# Patient Record
Sex: Male | Born: 1960 | Race: White | Hispanic: No | Marital: Married | Smoking: Former smoker
Health system: Southern US, Community
[De-identification: ages and names within clinical notes are randomized; demographics above are authoritative.]

## PROBLEM LIST (undated history)

## (undated) DIAGNOSIS — E785 Hyperlipidemia, unspecified: Secondary | ICD-10-CM

## (undated) DIAGNOSIS — K219 Gastro-esophageal reflux disease without esophagitis: Secondary | ICD-10-CM

## (undated) HISTORY — DX: Hyperlipidemia, unspecified: E78.5

## (undated) HISTORY — DX: Gastro-esophageal reflux disease without esophagitis: K21.9

---
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
## 2000-06-21 ENCOUNTER — Encounter: Admission: RE | Admit: 2000-06-21 | Discharge: 2000-06-21 | Payer: Self-pay | Admitting: Urology

## 2000-06-21 ENCOUNTER — Encounter: Payer: Self-pay | Admitting: Urology

## 2003-06-15 HISTORY — PX: VASECTOMY: SHX75

## 2004-01-15 ENCOUNTER — Ambulatory Visit (HOSPITAL_COMMUNITY): Admission: RE | Admit: 2004-01-15 | Discharge: 2004-01-15 | Payer: Self-pay | Admitting: Family Medicine

## 2004-09-11 ENCOUNTER — Encounter: Admission: RE | Admit: 2004-09-11 | Discharge: 2004-09-11 | Payer: Self-pay | Admitting: Family Medicine

## 2011-10-19 ENCOUNTER — Telehealth (INDEPENDENT_AMBULATORY_CARE_PROVIDER_SITE_OTHER): Payer: Self-pay | Admitting: Surgery

## 2011-10-25 ENCOUNTER — Encounter (INDEPENDENT_AMBULATORY_CARE_PROVIDER_SITE_OTHER): Payer: Self-pay | Admitting: Surgery

## 2011-10-25 ENCOUNTER — Ambulatory Visit (INDEPENDENT_AMBULATORY_CARE_PROVIDER_SITE_OTHER): Payer: BC Managed Care – PPO | Admitting: Surgery

## 2011-10-25 ENCOUNTER — Other Ambulatory Visit (INDEPENDENT_AMBULATORY_CARE_PROVIDER_SITE_OTHER): Payer: Self-pay | Admitting: Surgery

## 2011-10-25 VITALS — BP 120/78 | HR 59 | Temp 98.7°F | Ht 68.25 in | Wt 151.4 lb

## 2011-10-25 DIAGNOSIS — K4090 Unilateral inguinal hernia, without obstruction or gangrene, not specified as recurrent: Secondary | ICD-10-CM

## 2011-10-25 NOTE — Patient Instructions (Signed)
Hernia  A hernia occurs when an internal organ pushes out through a weak spot in the abdominal wall. Hernias most commonly occur in the groin and around the navel. Hernias often can be pushed back into place (reduced). Most hernias tend to get worse over time. Some abdominal hernias can get stuck in the opening (irreducible or incarcerated hernia) and cannot be reduced. An irreducible abdominal hernia which is tightly squeezed into the opening is at risk for impaired blood supply (strangulated hernia). A strangulated hernia is a medical emergency. Because of the risk for an irreducible or strangulated hernia, surgery may be recommended to repair a hernia.  CAUSES    Heavy lifting.   Prolonged coughing.   Straining to have a bowel movement.   A cut (incision) made during an abdominal surgery.  HOME CARE INSTRUCTIONS    Bed rest is not required. You may continue your normal activities.   Avoid lifting more than 10 pounds (4.5 kg) or straining.   Cough gently. If you are a smoker it is best to stop. Even the best hernia repair can break down with the continual strain of coughing. Even if you do not have your hernia repaired, a cough will continue to aggravate the problem.   Do not wear anything tight over your hernia. Do not try to keep it in with an outside bandage or truss. These can damage abdominal contents if they are trapped within the hernia sac.   Eat a normal diet.   Avoid constipation. Straining over long periods of time will increase hernia size and encourage breakdown of repairs. If you cannot do this with diet alone, stool softeners may be used.  SEEK IMMEDIATE MEDICAL CARE IF:    You have a fever.   You develop increasing abdominal pain.   You feel nauseous or vomit.   Your hernia is stuck outside the abdomen, looks discolored, feels hard, or is tender.   You have any changes in your bowel habits or in the hernia that are unusual for you.   You have increased pain or swelling around the  hernia.   You cannot push the hernia back in place by applying gentle pressure while lying down.  MAKE SURE YOU:    Understand these instructions.   Will watch your condition.   Will get help right away if you are not doing well or get worse.  Document Released: 05/31/2005 Document Revised: 05/20/2011 Document Reviewed: 01/18/2008  ExitCare Patient Information 2012 [employer].

## 2011-10-25 NOTE — Progress Notes (Signed)
Subjective:     Patient ID: Jonathon Lopez, male   DOB: [birth year], 51 y.o.   MRN: [id number]  HPI  Jonathon Lopez  [birth year] [id number]  Patient Care Team: Catha Gosselin, MD as PCP - General (Family Medicine)  This patient is a 51 y.o.male who presents today for surgical evaluation at the request of Dr. Clarene Duke.   Reason for visit: Right groin swelling. Probable hernia.  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 Noticed a lump & right groin pain after a work out. Was a ~2 months ago. Lump is now palpable & noticeable in the mirror. Some discomfort with it. No problems on the left side.  Quite active. Daily problems. No history of infection. Based on concerns, he when saw his primary care physician. Dr. Clarene Duke sent the patient to me for consideration of hernia repair.  Patient Active Problem List  Diagnoses  . Right inguinal hernia    Past Medical History  Diagnosis Date  . GERD (gastroesophageal reflux disease)   . Hyperlipidemia     Past Surgical History  Procedure Date  . Shoulder surgery 1978  . Vasectomy 2005    History   Social History  . Marital Status: Married    Spouse Name: N/A    Number of Children: N/A  . Years of Education: N/A   Occupational History  . Not on file.   Social History Main Topics  . Smoking status: Former Games developer  . Smokeless tobacco: Former Neurosurgeon    Quit date: 10/25/1995  . Alcohol Use: No  . Drug Use: No  . Sexually Active:    Other Topics Concern  . Not on file   Social History Narrative  . No narrative on file    Family History  Problem Relation Age of Onset  . Cancer Mother     liver    Current Outpatient Prescriptions  Medication Sig Dispense Refill  . lovastatin (MEVACOR) 40 MG tablet          No Known Allergies  BP 120/78  Pulse 59  Temp(Src) 98.7 F (37.1 C) (Temporal)  Ht 5' 8.25" (1.734 m)  Wt 151 lb 6.4 oz (68.675 kg)  BMI 22.85 kg/m2  SpO2 98%  No results found.   Review of Systems  Constitutional:  Negative for fever, chills and diaphoresis.  HENT: Negative for nosebleeds, sore throat, facial swelling, mouth sores, trouble swallowing and ear discharge.   Eyes: Negative for photophobia, discharge and visual disturbance.  Respiratory: Negative for choking, chest tightness, shortness of breath and stridor.   Cardiovascular: Negative for chest pain and palpitations.       Patient walks 3 hours for about 10 miles without difficulty.  No exertional chest/neck/shoulder/arm pain.   Gastrointestinal: Negative for nausea, vomiting, abdominal pain, diarrhea, constipation, blood in stool, abdominal distention, anal bleeding and rectal pain.  Genitourinary: Negative for dysuria, urgency, difficulty urinating and testicular pain.  Musculoskeletal: Negative for myalgias, back pain, arthralgias and gait problem.  Skin: Negative for color change, pallor, rash and wound.  Neurological: Negative for dizziness, speech difficulty, weakness, numbness and headaches.  Hematological: Negative for adenopathy. Does not bruise/bleed easily.  Psychiatric/Behavioral: Negative for hallucinations, confusion and agitation.       Objective:   Physical Exam  Constitutional: He is oriented to person, place, and time. He appears well-developed and well-nourished. No distress.  HENT:  Head: Normocephalic.  Mouth/Throat: Oropharynx is clear and moist. No oropharyngeal exudate.  Eyes: Conjunctivae and EOM are normal. Pupils are equal, round, and  reactive to light. No scleral icterus.  Neck: Normal range of motion. Neck supple. No tracheal deviation present.  Cardiovascular: Normal rate, regular rhythm and intact distal pulses.   Pulmonary/Chest: Effort normal and breath sounds normal. No respiratory distress.  Abdominal: Soft. He exhibits no distension. There is no tenderness. A hernia is present. Hernia confirmed positive in the right inguinal area. Hernia confirmed negative in the ventral area and confirmed negative in  the left inguinal area.    Musculoskeletal: Normal range of motion. He exhibits no tenderness.  Lymphadenopathy:    He has no cervical adenopathy.       Right: No inguinal adenopathy present.       Left: No inguinal adenopathy present.  Neurological: He is alert and oriented to person, place, and time. No cranial nerve deficit. He exhibits normal muscle tone. Coordination normal.  Skin: Skin is warm and dry. No rash noted. He is not diaphoretic. No erythema. No pallor.  Psychiatric: He has a normal mood and affect. His behavior is normal. Judgment and thought content normal.       Assessment:     RIH      Plan:     Lap RIH repair:  The anatomy & physiology of the abdominal wall and pelvic floor was discussed.  The pathophysiology of hernias in the inguinal and pelvic region was discussed.  Natural history risks such as progressive enlargement, pain, incarceration & strangulation was discussed.   Contributors to complications such as smoking, obesity, diabetes, prior surgery, etc were discussed.    I feel the risks of no intervention will lead to serious problems that outweigh the operative risks; therefore, I recommended surgery to reduce and repair the hernia.  I explained laparoscopic techniques with possible need for an open approach.  I noted usual use of mesh to patch and/or buttress hernia repair  Risks such as bleeding, infection, abscess, need for further treatment, heart attack, death, and other risks were discussed.  I noted a good likelihood this will help address the problem.   Goals of post-operative recovery were discussed as well.  Possibility that this will not correct all symptoms was explained.  I stressed the importance of low-impact activity, aggressive pain control, avoiding constipation, & not pushing through pain to minimize risk of post-operative chronic pain or injury. Possibility of reherniation was discussed.  We will work to minimize complications.     An  educational handout further explaining the pathology & treatment options was given as well.  Questions were answered.  The patient expresses understanding & wishes to proceed with surgery.

## 2011-10-26 ENCOUNTER — Telehealth (INDEPENDENT_AMBULATORY_CARE_PROVIDER_SITE_OTHER): Payer: Self-pay | Admitting: Surgery

## 2011-10-26 ENCOUNTER — Other Ambulatory Visit (INDEPENDENT_AMBULATORY_CARE_PROVIDER_SITE_OTHER): Payer: Self-pay | Admitting: Surgery

## 2011-10-27 ENCOUNTER — Ambulatory Visit (INDEPENDENT_AMBULATORY_CARE_PROVIDER_SITE_OTHER): Payer: Self-pay | Admitting: General Surgery

## 2011-10-28 DIAGNOSIS — K4090 Unilateral inguinal hernia, without obstruction or gangrene, not specified as recurrent: Secondary | ICD-10-CM

## 2011-11-10 ENCOUNTER — Ambulatory Visit (INDEPENDENT_AMBULATORY_CARE_PROVIDER_SITE_OTHER): Payer: Self-pay | Admitting: Surgery

## 2011-11-15 ENCOUNTER — Ambulatory Visit (INDEPENDENT_AMBULATORY_CARE_PROVIDER_SITE_OTHER): Payer: Self-pay | Admitting: General Surgery

## 2011-11-18 ENCOUNTER — Ambulatory Visit (INDEPENDENT_AMBULATORY_CARE_PROVIDER_SITE_OTHER): Payer: BC Managed Care – PPO | Admitting: Surgery

## 2011-11-18 ENCOUNTER — Encounter (INDEPENDENT_AMBULATORY_CARE_PROVIDER_SITE_OTHER): Payer: Self-pay | Admitting: Surgery

## 2011-11-18 VITALS — BP 120/68 | HR 72 | Temp 97.3°F | Resp 18 | Ht 68.0 in | Wt 152.2 lb

## 2011-11-18 DIAGNOSIS — K4090 Unilateral inguinal hernia, without obstruction or gangrene, not specified as recurrent: Secondary | ICD-10-CM

## 2011-11-18 NOTE — Progress Notes (Signed)
Subjective:     Patient ID: Jonathon Lopez, male   DOB: 1960-07-06, 51 y.o.   MRN: [id number]  HPI  Jonathon Lopez  1961/01/23 [id number]  Patient Care Team: Catha Gosselin, MD as PCP - General (Family Medicine)  This patient is a 51 y.o.male who presents today for surgical evaluation.   Procedure: Laparoscopic right inguinal hernia repair 10/28/2011  The patient comes in today feeling well.  Jogging some. Had some soreness with squat psoas holding off on the more intense weightlifting/Jim work. Had some burning with urination the first day but got better. Had mild constipation but is better. Feeling well. No fevers chills or sweats  Patient Active Problem List  Diagnoses  (none) - all problems resolved or deleted    Past Medical History  Diagnosis Date  . GERD (gastroesophageal reflux disease)   . Hyperlipidemia     Past Surgical History  Procedure Date  . Shoulder surgery 1978  . Vasectomy 2005    History   Social History  . Marital Status: Married    Spouse Name: N/A    Number of Children: N/A  . Years of Education: N/A   Occupational History  . Not on file.   Social History Main Topics  . Smoking status: Former Games developer  . Smokeless tobacco: Former Neurosurgeon    Quit date: 10/25/1995  . Alcohol Use: No  . Drug Use: No  . Sexually Active:    Other Topics Concern  . Not on file   Social History Narrative  . No narrative on file    Family History  Problem Relation Age of Onset  . Cancer Mother     liver    Current Outpatient Prescriptions  Medication Sig Dispense Refill  . lovastatin (MEVACOR) 40 MG tablet          No Known Allergies  BP 120/68  Pulse 72  Temp(Src) 97.3 F (36.3 C) (Temporal)  Resp 18  Ht 5\' 8"  (1.727 m)  Wt 152 lb 3.2 oz (69.037 kg)  BMI 23.14 kg/m2  No results found.   Review of Systems  Constitutional: Negative for fever, chills and diaphoresis.  HENT: Negative for sore throat, trouble swallowing and neck pain.   Eyes:  Negative for photophobia and visual disturbance.  Respiratory: Negative for choking and shortness of breath.   Cardiovascular: Negative for chest pain and palpitations.  Gastrointestinal: Negative for nausea, vomiting, abdominal distention, anal bleeding and rectal pain.  Genitourinary: Negative for dysuria, urgency, difficulty urinating and testicular pain.  Musculoskeletal: Negative for myalgias, arthralgias and gait problem.  Skin: Negative for color change and rash.  Neurological: Negative for dizziness, speech difficulty, weakness and numbness.  Hematological: Negative for adenopathy.  Psychiatric/Behavioral: Negative for hallucinations, confusion and agitation.       Objective:   Physical Exam  Constitutional: He is oriented to person, place, and time. He appears well-developed and well-nourished. No distress.  HENT:  Head: Normocephalic.  Mouth/Throat: Oropharynx is clear and moist. No oropharyngeal exudate.  Eyes: Conjunctivae and EOM are normal. Pupils are equal, round, and reactive to light. No scleral icterus.  Neck: Normal range of motion. No tracheal deviation present.  Cardiovascular: Normal rate, normal heart sounds and intact distal pulses.   Pulmonary/Chest: Effort normal. No respiratory distress.  Abdominal: Soft. He exhibits no distension. There is no tenderness. Hernia confirmed negative in the right inguinal area and confirmed negative in the left inguinal area.       Incisions clean with normal healing ridges.  No hernias  Musculoskeletal: Normal range of motion. He exhibits no tenderness.  Neurological: He is alert and oriented to person, place, and time. No cranial nerve deficit. He exhibits normal muscle tone. Coordination normal.  Skin: Skin is warm and dry. No rash noted. He is not diaphoretic.  Psychiatric: He has a normal mood and affect. His behavior is normal.       Assessment:     3 weeks s/p lap RIH, recovering well    Plan:     Increase activity  as tolerated.  Do not push through pain.  Heat/NSAIDs for flares of pain & after exercise  Advanced on diet as tolerated. Bowel regimen to avoid problems.  Return to clinic p.r.n. The patient expressed understanding and appreciation

## 2011-11-18 NOTE — Patient Instructions (Signed)

## 2011-12-09 ENCOUNTER — Ambulatory Visit: Admit: 2011-12-09 | Payer: Self-pay | Admitting: Surgery

## 2011-12-09 SURGERY — REPAIR, HERNIA, INGUINAL, LAPAROSCOPIC
Anesthesia: General | Laterality: Right

## 2015-10-03 DIAGNOSIS — F4321 Adjustment disorder with depressed mood: Secondary | ICD-10-CM | POA: Diagnosis not present

## 2015-10-31 DIAGNOSIS — L821 Other seborrheic keratosis: Secondary | ICD-10-CM | POA: Diagnosis not present

## 2016-02-07 DIAGNOSIS — F4321 Adjustment disorder with depressed mood: Secondary | ICD-10-CM | POA: Diagnosis not present

## 2016-03-01 DIAGNOSIS — F4321 Adjustment disorder with depressed mood: Secondary | ICD-10-CM | POA: Diagnosis not present

## 2016-10-04 DIAGNOSIS — N400 Enlarged prostate without lower urinary tract symptoms: Secondary | ICD-10-CM | POA: Diagnosis not present

## 2016-10-04 DIAGNOSIS — E559 Vitamin D deficiency, unspecified: Secondary | ICD-10-CM | POA: Diagnosis not present

## 2016-10-04 DIAGNOSIS — E785 Hyperlipidemia, unspecified: Secondary | ICD-10-CM | POA: Diagnosis not present

## 2016-10-07 DIAGNOSIS — Z0000 Encounter for general adult medical examination without abnormal findings: Secondary | ICD-10-CM | POA: Diagnosis not present

## 2016-10-07 DIAGNOSIS — R8290 Unspecified abnormal findings in urine: Secondary | ICD-10-CM | POA: Diagnosis not present

## 2017-01-25 DIAGNOSIS — F4321 Adjustment disorder with depressed mood: Secondary | ICD-10-CM | POA: Diagnosis not present

## 2017-02-04 DIAGNOSIS — F4321 Adjustment disorder with depressed mood: Secondary | ICD-10-CM | POA: Diagnosis not present

## 2017-02-27 ENCOUNTER — Encounter (HOSPITAL_COMMUNITY): Payer: Self-pay | Admitting: Emergency Medicine

## 2017-02-27 ENCOUNTER — Emergency Department (HOSPITAL_COMMUNITY)
Admission: EM | Admit: 2017-02-27 | Discharge: 2017-02-27 | Disposition: A | Discharge status: Home / Self Care | Payer: BLUE CROSS/BLUE SHIELD | Attending: Emergency Medicine | Admitting: Emergency Medicine

## 2017-02-27 DIAGNOSIS — Z87891 Personal history of nicotine dependence: Secondary | ICD-10-CM | POA: Diagnosis not present

## 2017-02-27 DIAGNOSIS — S058X2A Other injuries of left eye and orbit, initial encounter: Secondary | ICD-10-CM | POA: Diagnosis not present

## 2017-02-27 DIAGNOSIS — Z79899 Other long term (current) drug therapy: Secondary | ICD-10-CM | POA: Diagnosis not present

## 2017-02-27 DIAGNOSIS — H539 Unspecified visual disturbance: Secondary | ICD-10-CM | POA: Insufficient documentation

## 2017-02-27 NOTE — Discharge Instructions (Signed)
It was my pleasure taking care of you today!   Please go to the opthalmology clinic listed below at 4:00 PM today.   I have spoken with Dr. Charlotte Sanes who will meet you there.   [id number] [facility] [address] [place], [place] [address]

## 2017-02-27 NOTE — ED Triage Notes (Signed)
Patient sent from [facility] clinic for potential detached retina. Reports he was hit in the left eye with a volleyball on Wednesday. Since that time patient reports light flashing sensation. Denies blurred vision.

## 2017-02-27 NOTE — ED Provider Notes (Signed)
[facility]-EMERGENCY DEPT Provider Note   CSN: [id number] Arrival date & time: 02/27/17  1020     History   Chief Complaint Chief Complaint  Patient presents with  . Eye Injury    HPI Jonathon Lopez is a 56 y.o. male.  The history is provided by the patient and medical records. No language interpreter was used.  Eye Injury    Jonathon Lopez is a 56 y.o. male  with a PMH of HLD, GERD who presents to the Emergency Department complaining of visual changes to the left eye. Patient states that he was at his daughter's volleyball game 5 days ago. 3 days ago, patient developed a cone shaped flashing light sensation in the left eye. Flashing light sensation is constant, however is worse with movement of the eye. No history of similar. He had Lasik performed several years ago but no longer follows with eye doctors. Does not wear glasses or contact lenses. No blurry vision. Today, the area of light expanded and is a little bigger than it was in the last two days.    Past Medical History:  Diagnosis Date  . GERD (gastroesophageal reflux disease)   . Hyperlipidemia     There are no active problems to display for this patient.   Past Surgical History:  Procedure Laterality Date  . SHOULDER SURGERY  1978  . VASECTOMY  2005       Home Medications    Prior to Admission medications   Medication Sig Start Date End Date Taking? Authorizing Provider  lovastatin (MEVACOR) 40 MG tablet  08/24/11   [provider]    Family History Family History  Problem Relation Age of Onset  . Cancer Mother        liver    Social History Social History  Substance Use Topics  . Smoking status: Former Games developer  . Smokeless tobacco: Former Neurosurgeon    Quit date: 10/25/1995  . Alcohol use No     Allergies   Patient has no known allergies.   Review of Systems Review of Systems  Eyes: Positive for visual disturbance. Negative for photophobia.  All other systems reviewed and are  negative.    Physical Exam Updated Vital Signs BP 133/83 (BP Location: Right Arm)   Pulse 60   Temp 97.9 F (36.6 C) (Oral)   Resp 18   SpO2 97%   Physical Exam  Constitutional: He is oriented to person, place, and time. He appears well-developed and well-nourished. No distress.  HENT:  Head: Normocephalic and atraumatic.  Eyes:  PERRL, EOM. Visual fields normal.  Cardiovascular: Normal rate, regular rhythm and normal heart sounds.   No murmur heard. Pulmonary/Chest: Effort normal and breath sounds normal. No respiratory distress.  Abdominal: Soft. He exhibits no distension. There is no tenderness.  Musculoskeletal: He exhibits no edema.  Neurological: He is alert and oriented to person, place, and time.  Skin: Skin is warm and dry.  Nursing note and vitals reviewed.    ED Treatments / Results  Labs (all labs ordered are listed, but only abnormal results are displayed) Labs Reviewed - No data to display  EKG  EKG Interpretation None       Radiology No results found.  Procedures Procedures (including critical care time)  Medications Ordered in ED Medications - No data to display   Initial Impression / Assessment and Plan / ED Course  I have reviewed the triage vital signs and the nursing notes.  Pertinent labs & imaging results that  were available during my care of the patient were reviewed by me and considered in my medical decision making (see chart for details).    Jonathon Lopez is a 56 y.o. male who presents to ED for flashing light visual changes to the left eye x 2 days which worsened today. No blurry vision or change in visual acuity. PERRL, EOMI. Bedside ultrasound performed by attending, Dr. Rubin Payor, with no signs of retinal detachment. I spoke with ophthalmology, Dr. Charlotte Sanes, who will evaluate patient in the clinic today at [facility]. Patient aware of importance of going to clinic today at this time for further evaluation. He understands plan of care and  all questions were answered.   Patient seen by and discussed with Dr. Rubin Payor who agrees with treatment plan.   Final Clinical Impressions(s) / ED Diagnoses   Final diagnoses:  Visual changes    New Prescriptions Discharge Medication List as of 02/27/2017 11:34 AM       Solangel Mcmanaway, Chase Picket, PA-C 02/27/17 1232    Benjiman Core, MD 02/27/17 [phone]

## 2017-02-27 NOTE — ED Notes (Signed)
PA at bedside.

## 2017-03-01 DIAGNOSIS — H43812 Vitreous degeneration, left eye: Secondary | ICD-10-CM | POA: Diagnosis not present

## 2017-03-16 DIAGNOSIS — F4321 Adjustment disorder with depressed mood: Secondary | ICD-10-CM | POA: Diagnosis not present

## 2017-03-24 DIAGNOSIS — F4321 Adjustment disorder with depressed mood: Secondary | ICD-10-CM | POA: Diagnosis not present

## 2017-03-29 DIAGNOSIS — F4321 Adjustment disorder with depressed mood: Secondary | ICD-10-CM | POA: Diagnosis not present

## 2017-04-13 DIAGNOSIS — F4321 Adjustment disorder with depressed mood: Secondary | ICD-10-CM | POA: Diagnosis not present

## 2017-05-09 DIAGNOSIS — F4321 Adjustment disorder with depressed mood: Secondary | ICD-10-CM | POA: Diagnosis not present

## 2017-06-21 DIAGNOSIS — F4321 Adjustment disorder with depressed mood: Secondary | ICD-10-CM | POA: Diagnosis not present

## 2017-07-27 DIAGNOSIS — F4321 Adjustment disorder with depressed mood: Secondary | ICD-10-CM | POA: Diagnosis not present

## 2017-08-03 DIAGNOSIS — R8290 Unspecified abnormal findings in urine: Secondary | ICD-10-CM | POA: Diagnosis not present

## 2017-08-03 DIAGNOSIS — R109 Unspecified abdominal pain: Secondary | ICD-10-CM | POA: Diagnosis not present

## 2017-08-03 DIAGNOSIS — R509 Fever, unspecified: Secondary | ICD-10-CM | POA: Diagnosis not present

## 2017-08-28 DIAGNOSIS — K5792 Diverticulitis of intestine, part unspecified, without perforation or abscess without bleeding: Secondary | ICD-10-CM | POA: Diagnosis not present

## 2017-10-17 DIAGNOSIS — K5730 Diverticulosis of large intestine without perforation or abscess without bleeding: Secondary | ICD-10-CM | POA: Diagnosis not present

## 2017-10-17 DIAGNOSIS — Z1211 Encounter for screening for malignant neoplasm of colon: Secondary | ICD-10-CM | POA: Diagnosis not present

## 2017-10-17 DIAGNOSIS — Z8371 Family history of colonic polyps: Secondary | ICD-10-CM | POA: Diagnosis not present

## 2017-11-07 DIAGNOSIS — F4321 Adjustment disorder with depressed mood: Secondary | ICD-10-CM | POA: Diagnosis not present

## 2017-11-11 DIAGNOSIS — Z0000 Encounter for general adult medical examination without abnormal findings: Secondary | ICD-10-CM | POA: Diagnosis not present

## 2017-11-11 DIAGNOSIS — E785 Hyperlipidemia, unspecified: Secondary | ICD-10-CM | POA: Diagnosis not present

## 2017-11-11 DIAGNOSIS — Z23 Encounter for immunization: Secondary | ICD-10-CM | POA: Diagnosis not present

## 2017-11-11 DIAGNOSIS — R8290 Unspecified abnormal findings in urine: Secondary | ICD-10-CM | POA: Diagnosis not present

## 2017-11-11 DIAGNOSIS — Z125 Encounter for screening for malignant neoplasm of prostate: Secondary | ICD-10-CM | POA: Diagnosis not present

## 2018-02-24 DIAGNOSIS — K5792 Diverticulitis of intestine, part unspecified, without perforation or abscess without bleeding: Secondary | ICD-10-CM | POA: Diagnosis not present

## 2018-05-15 DIAGNOSIS — R109 Unspecified abdominal pain: Secondary | ICD-10-CM | POA: Diagnosis not present

## 2018-06-05 DIAGNOSIS — F4321 Adjustment disorder with depressed mood: Secondary | ICD-10-CM | POA: Diagnosis not present

## 2018-06-27 DIAGNOSIS — F4321 Adjustment disorder with depressed mood: Secondary | ICD-10-CM | POA: Diagnosis not present

## 2018-07-24 DIAGNOSIS — F4321 Adjustment disorder with depressed mood: Secondary | ICD-10-CM | POA: Diagnosis not present

## 2018-12-21 DIAGNOSIS — N400 Enlarged prostate without lower urinary tract symptoms: Secondary | ICD-10-CM | POA: Diagnosis not present

## 2018-12-21 DIAGNOSIS — Z79899 Other long term (current) drug therapy: Secondary | ICD-10-CM | POA: Diagnosis not present

## 2018-12-21 DIAGNOSIS — Z8371 Family history of colonic polyps: Secondary | ICD-10-CM | POA: Diagnosis not present

## 2018-12-21 DIAGNOSIS — E785 Hyperlipidemia, unspecified: Secondary | ICD-10-CM | POA: Diagnosis not present

## 2018-12-27 DIAGNOSIS — E785 Hyperlipidemia, unspecified: Secondary | ICD-10-CM | POA: Diagnosis not present

## 2018-12-27 DIAGNOSIS — Z79899 Other long term (current) drug therapy: Secondary | ICD-10-CM | POA: Diagnosis not present

## 2018-12-27 DIAGNOSIS — Z125 Encounter for screening for malignant neoplasm of prostate: Secondary | ICD-10-CM | POA: Diagnosis not present

## 2019-02-14 DIAGNOSIS — D1801 Hemangioma of skin and subcutaneous tissue: Secondary | ICD-10-CM | POA: Diagnosis not present

## 2019-02-14 DIAGNOSIS — L821 Other seborrheic keratosis: Secondary | ICD-10-CM | POA: Diagnosis not present

## 2019-02-14 DIAGNOSIS — D225 Melanocytic nevi of trunk: Secondary | ICD-10-CM | POA: Diagnosis not present

## 2019-06-16 DIAGNOSIS — Z20828 Contact with and (suspected) exposure to other viral communicable diseases: Secondary | ICD-10-CM | POA: Diagnosis not present

## 2019-07-10 DIAGNOSIS — K5792 Diverticulitis of intestine, part unspecified, without perforation or abscess without bleeding: Secondary | ICD-10-CM | POA: Diagnosis not present

## 2019-07-11 ENCOUNTER — Other Ambulatory Visit: Payer: BLUE CROSS/BLUE SHIELD

## 2019-07-11 ENCOUNTER — Ambulatory Visit: Payer: BC Managed Care – PPO | Attending: Internal Medicine

## 2019-07-11 DIAGNOSIS — Z20822 Contact with and (suspected) exposure to covid-19: Secondary | ICD-10-CM | POA: Diagnosis not present

## 2019-07-12 LAB — NOVEL CORONAVIRUS, NAA: SARS-CoV-2, NAA: NOT DETECTED

## 2019-07-18 ENCOUNTER — Ambulatory Visit: Payer: BC Managed Care – PPO | Attending: Internal Medicine

## 2019-07-18 DIAGNOSIS — Z20822 Contact with and (suspected) exposure to covid-19: Secondary | ICD-10-CM

## 2019-07-19 LAB — NOVEL CORONAVIRUS, NAA: SARS-CoV-2, NAA: NOT DETECTED

## 2019-08-10 ENCOUNTER — Ambulatory Visit: Payer: BC Managed Care – PPO | Attending: Internal Medicine

## 2019-08-10 DIAGNOSIS — Z20822 Contact with and (suspected) exposure to covid-19: Secondary | ICD-10-CM

## 2019-08-11 LAB — NOVEL CORONAVIRUS, NAA: SARS-CoV-2, NAA: NOT DETECTED

## 2019-10-31 DIAGNOSIS — R002 Palpitations: Secondary | ICD-10-CM | POA: Diagnosis not present

## 2019-10-31 DIAGNOSIS — R221 Localized swelling, mass and lump, neck: Secondary | ICD-10-CM | POA: Diagnosis not present

## 2019-10-31 DIAGNOSIS — R8290 Unspecified abnormal findings in urine: Secondary | ICD-10-CM | POA: Diagnosis not present

## 2019-10-31 DIAGNOSIS — Z0000 Encounter for general adult medical examination without abnormal findings: Secondary | ICD-10-CM | POA: Diagnosis not present

## 2019-11-07 ENCOUNTER — Other Ambulatory Visit: Payer: Self-pay | Admitting: Family Medicine

## 2019-11-07 DIAGNOSIS — Z125 Encounter for screening for malignant neoplasm of prostate: Secondary | ICD-10-CM | POA: Diagnosis not present

## 2019-11-07 DIAGNOSIS — Z0000 Encounter for general adult medical examination without abnormal findings: Secondary | ICD-10-CM | POA: Diagnosis not present

## 2019-11-07 DIAGNOSIS — E785 Hyperlipidemia, unspecified: Secondary | ICD-10-CM | POA: Diagnosis not present

## 2019-11-07 DIAGNOSIS — R221 Localized swelling, mass and lump, neck: Secondary | ICD-10-CM

## 2019-11-13 ENCOUNTER — Ambulatory Visit
Admission: RE | Admit: 2019-11-13 | Discharge: 2019-11-13 | Disposition: A | Discharge status: Home / Self Care | Payer: BC Managed Care – PPO | Source: Ambulatory Visit | Attending: Family Medicine | Admitting: Family Medicine

## 2019-11-13 ENCOUNTER — Other Ambulatory Visit: Payer: Self-pay

## 2019-11-13 DIAGNOSIS — R221 Localized swelling, mass and lump, neck: Secondary | ICD-10-CM | POA: Diagnosis not present

## 2020-05-03 DIAGNOSIS — Z20822 Contact with and (suspected) exposure to covid-19: Secondary | ICD-10-CM | POA: Diagnosis not present

## 2020-06-19 ENCOUNTER — Other Ambulatory Visit: Payer: BC Managed Care – PPO

## 2020-06-19 DIAGNOSIS — Z20822 Contact with and (suspected) exposure to covid-19: Secondary | ICD-10-CM

## 2020-06-22 ENCOUNTER — Telehealth: Payer: Self-pay | Admitting: Family Medicine

## 2020-06-22 LAB — NOVEL CORONAVIRUS, NAA: SARS-CoV-2, NAA: DETECTED — AB

## 2020-06-22 NOTE — Telephone Encounter (Signed)
Pt has now viewed MyChart results- MyChart message sent and call back number provided.

## 2020-06-22 NOTE — Telephone Encounter (Signed)
PT returning call for test results / positeve / please advise  

## 2021-01-12 ENCOUNTER — Other Ambulatory Visit: Payer: Self-pay | Admitting: Family Medicine

## 2021-01-12 DIAGNOSIS — E7800 Pure hypercholesterolemia, unspecified: Secondary | ICD-10-CM

## 2021-02-03 ENCOUNTER — Ambulatory Visit
Admission: RE | Admit: 2021-02-03 | Discharge: 2021-02-03 | Disposition: A | Discharge status: Home / Self Care | Payer: No Typology Code available for payment source | Source: Ambulatory Visit | Attending: Family Medicine | Admitting: Family Medicine

## 2021-02-03 DIAGNOSIS — E7800 Pure hypercholesterolemia, unspecified: Secondary | ICD-10-CM

## 2021-04-16 ENCOUNTER — Ambulatory Visit: Payer: 59 | Admitting: Cardiovascular Disease

## 2021-04-16 ENCOUNTER — Encounter: Payer: Self-pay | Admitting: Cardiovascular Disease

## 2021-04-16 ENCOUNTER — Other Ambulatory Visit: Payer: Self-pay

## 2021-04-16 VITALS — BP 110/74 | HR 65 | Ht 68.0 in | Wt 163.2 lb

## 2021-04-16 DIAGNOSIS — R002 Palpitations: Secondary | ICD-10-CM | POA: Diagnosis not present

## 2021-04-16 DIAGNOSIS — E782 Mixed hyperlipidemia: Secondary | ICD-10-CM

## 2021-04-16 DIAGNOSIS — I2584 Coronary atherosclerosis due to calcified coronary lesion: Secondary | ICD-10-CM

## 2021-04-16 DIAGNOSIS — I2510 Atherosclerotic heart disease of native coronary artery without angina pectoris: Secondary | ICD-10-CM | POA: Diagnosis not present

## 2021-04-16 MED ORDER — ROSUVASTATIN CALCIUM 20 MG PO TABS: 20.0000 mg | ORAL_TABLET | Freq: Every day | ORAL | 3 refills | Status: DC

## 2021-04-16 MED ORDER — METOPROLOL TARTRATE 50 MG PO TABS: ORAL_TABLET | ORAL | 0 refills | Status: DC

## 2021-04-16 NOTE — Patient Instructions (Addendum)
Medication Instructions:  START ROSUVASTATIN 20 MG EACH DAY. TAKE METOPROLOL 50 MG TWO HOURS BEFORE CT. *If you need a refill on your cardiac medications before your next appointment, please call your pharmacy*   Lab Work: RETURN IN 2 MONTHS FOR FASTING BLOOD WORK (CMET, LIPIDS, LIPOPROTEIN A) If you have labs (blood work) drawn today and your tests are completely normal, you will receive your results only by: MyChart Message (if you have MyChart) OR A paper copy in the mail If you have any lab test that is abnormal or we need to change your treatment, we will call you to review the results.   Testing/Procedures: Your physician has requested that you have an echocardiogram. Echocardiography is a painless test that uses sound waves to create images of your heart. It provides your doctor with information about the size and shape of your heart and how well your heart's chambers and valves are working. This procedure takes approximately one hour. There are no restrictions for this procedure.  Your physician has requested that you have cardiac CT. Cardiac computed tomography (CT) is a painless test that uses an x-ray machine to take clear, detailed pictures of your heart. For further information please visit [email or web address] Please follow instruction sheet as given.     Follow-Up: At [facility], you and your health needs are our priority.  As part of our continuing mission to provide you with exceptional heart care, we have created designated Provider Care Teams.  These Care Teams include your primary Cardiologist (physician) and Advanced Practice Providers (APPs -  Physician Assistants and Nurse Practitioners) who all work together to provide you with the care you need, when you need it.  We recommend signing up for the patient portal called "MyChart".  Sign up information is provided on this After Visit Summary.  MyChart is used to connect with patients for Virtual Visits  (Telemedicine).  Patients are able to view lab/test results, encounter notes, upcoming appointments, etc.  Non-urgent messages can be sent to your provider as well.   To learn more about what you can do with MyChart, go to [email or web address].    Your next appointment:   3 month(s)  The format for your next appointment:   In Person  Provider:   Nicki Guadalajara, MD   Other Instructions   Your cardiac CT will be scheduled at one of the below locations:   [facility] [address] [place], [place] [address] [phone]   If scheduled at [facility], please arrive at the [facility] main entrance (entrance A) of [facility] 30 minutes prior to test start time. You can use the FREE valet parking offered at the main entrance (encouraged to control the heart rate for the test) Proceed to the [facility] Radiology Department (first floor) to check-in and test prep.   Please follow these instructions carefully (unless otherwise directed):  Hold all erectile dysfunction medications at least 3 days (72 hrs) prior to test.  On the Night Before the Test: Be sure to Drink plenty of water. Do not consume any caffeinated/decaffeinated beverages or chocolate 12 hours prior to your test. Do not take any antihistamines 12 hours prior to your test.   On the Day of the Test: Drink plenty of water until 1 hour prior to the test. Do not eat any food 4 hours prior to the test. You may take your regular medications prior to the test.  Take metoprolol (Lopressor) 50 mg two hours prior  to test. HOLD Furosemide/Hydrochlorothiazide morning of the test. FEMALES- please wear underwire-free bra if available, avoid dresses & tight clothing        After the Test: Drink plenty of water. After receiving IV contrast, you may experience a mild flushed feeling. This is normal. On occasion, you may experience a mild rash up to 24 hours after the test. This is not  dangerous. If this occurs, you can take Benadryl 25 mg and increase your fluid intake. If you experience trouble breathing, this can be serious. If it is severe call 911 IMMEDIATELY. If it is mild, please call our office. If you take any of these medications: Glipizide/Metformin, Avandament, Glucavance, please do not take 48 hours after completing test unless otherwise instructed.  Please allow 2-4 weeks for scheduling of routine cardiac CTs. Some insurance companies require a pre-authorization which may delay scheduling of this test.   For non-scheduling related questions, please contact the cardiac imaging nurse navigator should you have any questions/concerns: Rockwell Alexandria, Cardiac Imaging Nurse Navigator Larey Brick, Cardiac Imaging Nurse Navigator [facility] Direct Office Dial: [phone]   For scheduling needs, including cancellations and rescheduling, please call [place], [phone].

## 2021-04-16 NOTE — Progress Notes (Signed)
Cardiology Office Note    Date:  04/23/2021   ID:  Jonathon Lopez, DOB 1961/02/10, MRN [id number]  PCP:  Dr. Antony Contras  Cardiologist:  Shelva Majestic, MD   New cardiology evaluation referred through the courtesy of Dr. 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  History of Present Illness:  Jonathon Lopez is a 60 y.o. male who denies any cardiac history.  The patient has remained active and runs on a daily basis, typically 3 miles at x4 to 5 miles at a time.  He ran a half marathon earlier this year.  He denies any history of chest pain.  He does note occasional heart skipping occurs mostly when he is resting.  He recently came more cognizant of his diet and excluded aspartame with improvement in his palpitations.  He runs he typically runs a 10-minute mile and he believes his heart rate may increase to around 160 bpm.  He is unaware of exertional palpitations.  He has a history of hyperlipidemia and had been on lovastatin for years, but stopped using treatment in early 2021.  He currently is not on any medications.  He now sees Dr. Marijo File since Dr. Hulan Fess had retired he was referred for CT cardiac scoring.  This was done on February 03, 2021 and showed a calcium score of 33.7, representing 55th percentile for age, gender and ethnicity.  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  A normal-appearing right coronary artery was not identified on this examination and cardiology consultation was recommended.  Patient was also noted to have a few tiny pulmonary nodules with the largest measuring 2 mm.  He is now referred for cardiology evaluation.  Past Medical History:  Diagnosis Date   GERD (gastroesophageal reflux disease)    Hyperlipidemia     Past Surgical History:  Procedure Laterality Date   SHOULDER SURGERY  1978   VASECTOMY  2005    Current Medications: Outpatient Medications Prior to Visit   Medication Sig Dispense Refill   lovastatin (MEVACOR) 40 MG tablet      No facility-administered medications prior to visit.     Allergies:   Patient has no known allergies.   Social History   Socioeconomic History   Marital status: Married    Spouse name: Not on file   Number of children: Not on file   Years of education: Not on file   Highest education level: Not on file  Occupational History   Not on file  Tobacco Use   Smoking status: Former   Smokeless tobacco: Former    Quit date: 10/25/1995  Substance and Sexual Activity   Alcohol use: No   Drug use: No   Sexual activity: Not on file  Other Topics Concern   Not on file  Social History Narrative   Not on file   Social Determinants of Health   Financial Resource Strain: Not on file  Food Insecurity: Not on file  Transportation Needs: Not on file  Physical Activity: Not on file  Stress: Not on file  Social Connections: Not on file    Social history is notable that he was born in [place].  He moved and went to high school in [place].  He went to [employer] for college and did graduate work at [employer].  He is married for 24 years and has 2 children.  Currently he works in Engineer, technical sales for [employer].  There is remote tobacco use and he quit in 1995.  His remote alcohol use with recovery for 38 years.  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  Family History: His mother died at age 39 and had cancer.  Father died at age 50 and may have had a coronary event in his 9s.  He has a half brother age 71 who is alive and well.  His sister is 68 and stable.  His children are 53 and 21 with a child at [facility].  ROS General: Negative; No fevers, chills, or night sweats;  HEENT: Negative; No changes in vision or hearing, sinus congestion, difficulty swallowing Pulmonary: Negative; No cough, wheezing, shortness of breath, hemoptysis Cardiovascular: Negative; No chest pain,  presyncope, syncope, palpitations GI: Negative; No nausea, vomiting, diarrhea, or abdominal pain GU: Negative; No dysuria, hematuria, or difficulty voiding Musculoskeletal: Negative; no myalgias, joint pain, or weakness Hematologic/Oncology: Negative; no easy bruising, bleeding Endocrine: Negative; no heat/cold intolerance; no diabetes Neuro: Negative; no changes in balance, headaches Skin: Negative; No rashes or skin lesions Psychiatric: Negative; No behavioral problems, depression Sleep: Negative; No snoring, daytime sleepiness, hypersomnolence, bruxism, restless legs, hypnogognic hallucinations, no cataplexy Other comprehensive 14 point system review is negative.   PHYSICAL EXAM:   VS:  BP 110/74   Pulse 65   Ht 5\' 8"  (1.727 m)   Wt 163 lb 3.2 oz (74 kg)   SpO2 99%   BMI 24.81 kg/m     Repeat blood pressure by me was 112/76  Wt Readings from Last 3 Encounters:  04/16/21 163 lb 3.2 oz (74 kg)  11/18/11 152 lb 3.2 oz (69 kg)  10/25/11 151 lb 6.4 oz (68.7 kg)    General: Alert, oriented, no distress.  Skin: normal turgor, no rashes, warm and dry HEENT: Normocephalic, atraumatic. Pupils equal round and reactive to light; sclera anicteric; extraocular muscles intact;  Nose without nasal septal hypertrophy Mouth/Parynx benign; Mallinpatti scale 3 Neck: No JVD, no carotid bruits; normal carotid upstroke Lungs: clear to ausculatation and percussion; no wheezing or rales Chest wall: without tenderness to palpitation Heart: PMI not displaced, RRR, s1 s2 normal, 1/6 systolic murmur, no diastolic murmur, no rubs, gallops, thrills, or heaves Abdomen: soft, nontender; no hepatosplenomehaly, BS+; abdominal aorta nontender and not dilated by palpation. Back: no CVA tenderness Pulses 2+ Musculoskeletal: full range of motion, normal strength, no joint deformities Extremities: no clubbing cyanosis or edema, Homan's sign negative  Neurologic: grossly nonfocal; Cranial nerves grossly  wnl Psychologic: Normal mood and affect   Studies/Labs Reviewed:   EKG:  EKG is ordered today.  ECG (independently read by me):  Sinus rhythm at 65 with PAC  Recent Labs: No flowsheet data found.   No flowsheet data found.  No flowsheet data found. No results found for: MCV No results found for: TSH No results found for: HGBA1C   BNP No results found for: BNP  ProBNP No results found for: PROBNP   Lipid Panel  No results found for: CHOL, TRIG, HDL, CHOLHDL, VLDL, LDLCALC, LDLDIRECT, LABVLDL   RADIOLOGY: No results found.   Additional studies/ records that were reviewed today include:  I reviewed the records from [facility].  CT cardiac scoring was reviewed.  Laboratory from January 06, 2021 was reviewed which showed a total cholesterol 233, LDL 145, HDL 51, triglycerides 204.  ASSESSMENT:    1. Coronary artery calcification   2. Palpitations   3. Mixed hyperlipidemia  PLAN:  Mr. Ezequias Walther is a very pleasant 60 year old gentleman who has been active for most of his life.  Currently he continues to run daily basis weekly runs at least 3 miles but sometimes 4 to 5 miles at a time.  Earlier this year he ran a half marathon without difficulty.  His typical pace is that of a 10-minute mile.  He notes his heart rate increases appropriately into the 150-160 range.  At time when he is resting he does note occasional "skips "which he believes are PACs.  There is a history of hyperlipidemia and he had been on lipid-lowering therapy for over 20 years but stopped treatment in early 2021.  I reviewed his most recent lipid panel which is elevated and suggests a mixed hyperlipidemic picture.  LDL cholesterol was 145 and triglycerides 204 with total cholesterol 233 and HDL 51.  I reviewed his CT cardiac scoring evaluation which showed calcium score of 33.7 and raised concern for a possible hypoplastic RCA.  As result I have recommended he undergo coronary CT angiography for  further evaluation.  With his coronary calcification I am also initiating lipid-lowering therapy with rosuvastatin 20 mg daily.  I have recommended he undergo a 2D echo Doppler study to evaluate both systolic and diastolic function and valvular architecture.  In several months he will undergo repeat laboratory with a comprehensive metabolic panel, fasting lipid panel, as well as LP(a) and I will see him in the office for follow-up evaluation or sooner as needed.     Medication Adjustments/Labs and Tests Ordered: Current medicines are reviewed at length with the patient today.  Concerns regarding medicines are outlined above.  Medication changes, Labs and Tests ordered today are listed in the Patient Instructions below. Patient Instructions  Medication Instructions:  START ROSUVASTATIN 20 MG EACH DAY. TAKE METOPROLOL 50 MG TWO HOURS BEFORE CT. *If you need a refill on your cardiac medications before your next appointment, please call your pharmacy*   Lab Work: RETURN IN 2 MONTHS FOR FASTING BLOOD WORK (CMET, LIPIDS, LIPOPROTEIN A) If you have labs (blood work) drawn today and your tests are completely normal, you will receive your results only by: [place] (if you have MyChart) OR A paper copy in the mail If you have any lab test that is abnormal or we need to change your treatment, we will call you to review the results.   Testing/Procedures: Your physician has requested that you have an echocardiogram. Echocardiography is a painless test that uses sound waves to create images of your heart. It provides your doctor with information about the size and shape of your heart and how well your heart's chambers and valves are working. This procedure takes approximately one hour. There are no restrictions for this procedure.  Your physician has requested that you have cardiac CT. Cardiac computed tomography (CT) is a painless test that uses an x-ray machine to take clear, detailed pictures of  your heart. For further information please visit [email or web address]. Please follow instruction sheet as given.     Follow-Up: At [facility], you and your health needs are our priority.  As part of our continuing mission to provide you with exceptional heart care, we have created designated Provider Care Teams.  These Care Teams include your primary Cardiologist (physician) and Advanced Practice Providers (APPs -  Physician Assistants and Nurse Practitioners) who all work together to provide you with the care you need, when you need it.  We recommend signing up for the  patient portal called "MyChart".  Sign up information is provided on this After Visit Summary.  MyChart is used to connect with patients for Virtual Visits (Telemedicine).  Patients are able to view lab/test results, encounter notes, upcoming appointments, etc.  Non-urgent messages can be sent to your provider as well.   To learn more about what you can do with MyChart, go to [email or web address].    Your next appointment:   3 month(s)  The format for your next appointment:   In Person  Provider:   Shelva Majestic, MD   Other Instructions   Your cardiac CT will be scheduled at one of the below locations:   [facility] [address] [place], [place] [address] [phone]   If scheduled at [facility], please arrive at the [facility] main entrance (entrance A) of [facility] 30 minutes prior to test start time. You can use the FREE valet parking offered at the main entrance (encouraged to control the heart rate for the test) Proceed to the [facility] Radiology Department (first floor) to check-in and test prep.   Please follow these instructions carefully (unless otherwise directed):  Hold all erectile dysfunction medications at least 3 days (72 hrs) prior to test.  On the Night Before the Test: Be sure to Drink plenty of water. Do not consume any caffeinated/decaffeinated  beverages or chocolate 12 hours prior to your test. Do not take any antihistamines 12 hours prior to your test.   On the Day of the Test: Drink plenty of water until 1 hour prior to the test. Do not eat any food 4 hours prior to the test. You may take your regular medications prior to the test.  Take metoprolol (Lopressor) 50 mg two hours prior to test. HOLD Furosemide/Hydrochlorothiazide morning of the test. FEMALES- please wear underwire-free bra if available, avoid dresses & tight clothing        After the Test: Drink plenty of water. After receiving IV contrast, you may experience a mild flushed feeling. This is normal. On occasion, you may experience a mild rash up to 24 hours after the test. This is not dangerous. If this occurs, you can take Benadryl 25 mg and increase your fluid intake. If you experience trouble breathing, this can be serious. If it is severe call 911 IMMEDIATELY. If it is mild, please call our office. If you take any of these medications: Glipizide/Metformin, Avandament, Glucavance, please do not take 48 hours after completing test unless otherwise instructed.  Please allow 2-4 weeks for scheduling of routine cardiac CTs. Some insurance companies require a pre-authorization which may delay scheduling of this test.   For non-scheduling related questions, please contact the cardiac imaging nurse navigator should you have any questions/concerns: Marchia Bond, Cardiac Imaging Nurse Navigator Gordy Clement, Cardiac Imaging Nurse Navigator [facility] Direct Office Dial: [phone]   For scheduling needs, including cancellations and rescheduling, please call [place], [phone].    Signed, Shelva Majestic, MD  04/23/2021 6:36 PM    [place] [place] [address], [place], [place], [place]  [address] Phone: [phone]

## 2021-04-23 ENCOUNTER — Encounter: Payer: Self-pay | Admitting: Cardiovascular Disease

## 2021-04-27 ENCOUNTER — Telehealth (HOSPITAL_COMMUNITY): Payer: Self-pay | Admitting: Emergency Medicine

## 2021-04-27 NOTE — Telephone Encounter (Signed)
Reaching out to patient to offer assistance regarding upcoming cardiac imaging study; pt verbalizes understanding of appt date/time, parking situation and where to check in, pre-test NPO status and medications ordered, and verified current allergies; name and call back number provided for further questions should they arise Rockwell Alexandria RN Navigator Cardiac Imaging [facility] [phone] office [phone] cell  Reminded to get labs today  50mg  metoprolol tartrate  Denies iv issues

## 2021-04-28 ENCOUNTER — Encounter (HOSPITAL_COMMUNITY): Payer: Self-pay

## 2021-04-28 ENCOUNTER — Other Ambulatory Visit: Payer: Self-pay

## 2021-04-28 ENCOUNTER — Ambulatory Visit (HOSPITAL_COMMUNITY)
Admission: RE | Admit: 2021-04-28 | Discharge: 2021-04-28 | Disposition: A | Discharge status: Home / Self Care | Payer: 59 | Source: Ambulatory Visit | Attending: Cardiovascular Disease | Admitting: Cardiovascular Disease

## 2021-04-28 DIAGNOSIS — I2584 Coronary atherosclerosis due to calcified coronary lesion: Secondary | ICD-10-CM | POA: Diagnosis not present

## 2021-04-28 DIAGNOSIS — I2510 Atherosclerotic heart disease of native coronary artery without angina pectoris: Secondary | ICD-10-CM | POA: Insufficient documentation

## 2021-04-28 LAB — COMPREHENSIVE METABOLIC PANEL
ALT: 18 IU/L (ref 0–44)
AST: 25 IU/L (ref 0–40)
Albumin/Globulin Ratio: 2 (ref 1.2–2.2)
Albumin: 4.4 g/dL (ref 3.8–4.9)
Alkaline Phosphatase: 77 IU/L (ref 44–121)
BUN/Creatinine Ratio: 10 (ref 10–24)
BUN: 11 mg/dL (ref 8–27)
Bilirubin Total: 0.7 mg/dL (ref 0.0–1.2)
CO2: 26 mmol/L (ref 20–29)
Calcium: 9.2 mg/dL (ref 8.6–10.2)
Chloride: 100 mmol/L (ref 96–106)
Creatinine, Ser: 1.08 mg/dL (ref 0.76–1.27)
Globulin, Total: 2.2 g/dL (ref 1.5–4.5)
Glucose: 87 mg/dL (ref 70–99)
Potassium: 4.4 mmol/L (ref 3.5–5.2)
Sodium: 140 mmol/L (ref 134–144)
Total Protein: 6.6 g/dL (ref 6.0–8.5)
eGFR: 79 mL/min/{1.73_m2} (ref >59)

## 2021-04-28 LAB — LIPID PANEL
Chol/HDL Ratio: 3.2 ratio (ref 0.0–5.0)
Cholesterol, Total: 149 mg/dL (ref 100–199)
HDL: 47 mg/dL (ref >39)
LDL Chol Calc (NIH): 89 mg/dL (ref 0–99)
Triglycerides: 62 mg/dL (ref 0–149)
VLDL Cholesterol Cal: 13 mg/dL (ref 5–40)

## 2021-04-28 LAB — LIPOPROTEIN A (LPA): Lipoprotein (a): 209.7 nmol/L — ABNORMAL HIGH (ref <75.0)

## 2021-04-28 MED ORDER — METOPROLOL TARTRATE 5 MG/5ML IV SOLN
INTRAVENOUS | Status: AC
  Filled 2021-04-28: qty 5

## 2021-04-28 MED ORDER — NITROGLYCERIN 0.4 MG SL SUBL
0.8000 mg | SUBLINGUAL_TABLET | Freq: Once | SUBLINGUAL | Status: AC
  Administered 2021-04-28: 0.8 mg via SUBLINGUAL

## 2021-04-28 MED ORDER — METOPROLOL TARTRATE 5 MG/5ML IV SOLN
5.0000 mg | Freq: Once | INTRAVENOUS | Status: AC
  Administered 2021-04-28: 5 mg via INTRAVENOUS

## 2021-04-28 MED ORDER — NITROGLYCERIN 0.4 MG SL SUBL
SUBLINGUAL_TABLET | SUBLINGUAL | Status: AC
  Filled 2021-04-28: qty 2

## 2021-04-28 MED ORDER — IOHEXOL 350 MG/ML SOLN
100.0000 mL | Freq: Once | INTRAVENOUS | Status: AC | PRN
  Administered 2021-04-28: 100 mL via INTRAVENOUS

## 2021-04-29 ENCOUNTER — Telehealth: Payer: Self-pay | Admitting: Cardiovascular Disease

## 2021-04-29 NOTE — Telephone Encounter (Signed)
Received call from [facility]. They reported lesions on right hepatic lobe. Report is in Epic. Will forward this to Dr. Tresa Endo.

## 2021-04-29 NOTE — Telephone Encounter (Signed)
[facility] calling in CT results.

## 2021-04-30 ENCOUNTER — Other Ambulatory Visit: Payer: Self-pay | Admitting: Family Medicine

## 2021-04-30 DIAGNOSIS — K769 Liver disease, unspecified: Secondary | ICD-10-CM

## 2021-04-30 DIAGNOSIS — R932 Abnormal findings on diagnostic imaging of liver and biliary tract: Secondary | ICD-10-CM

## 2021-05-06 ENCOUNTER — Other Ambulatory Visit: Payer: Self-pay

## 2021-05-06 ENCOUNTER — Ambulatory Visit (HOSPITAL_COMMUNITY): Payer: 59 | Attending: Cardiovascular Disease

## 2021-05-06 DIAGNOSIS — I2510 Atherosclerotic heart disease of native coronary artery without angina pectoris: Secondary | ICD-10-CM | POA: Diagnosis not present

## 2021-05-06 DIAGNOSIS — R002 Palpitations: Secondary | ICD-10-CM | POA: Diagnosis not present

## 2021-05-06 LAB — ECHOCARDIOGRAM COMPLETE
Area-P 1/2: 4.06 cm2
S' Lateral: 2.9 cm

## 2021-05-26 ENCOUNTER — Ambulatory Visit
Admission: RE | Admit: 2021-05-26 | Discharge: 2021-05-26 | Disposition: A | Discharge status: Home / Self Care | Payer: 59 | Source: Ambulatory Visit | Attending: Family Medicine | Admitting: Family Medicine

## 2021-05-26 ENCOUNTER — Other Ambulatory Visit: Payer: Self-pay

## 2021-05-26 DIAGNOSIS — K769 Liver disease, unspecified: Secondary | ICD-10-CM

## 2021-05-26 DIAGNOSIS — R932 Abnormal findings on diagnostic imaging of liver and biliary tract: Secondary | ICD-10-CM

## 2021-05-26 MED ORDER — GADOBENATE DIMEGLUMINE 529 MG/ML IV SOLN
14.0000 mL | Freq: Once | INTRAVENOUS | Status: AC | PRN
  Administered 2021-05-26: 14 mL via INTRAVENOUS

## 2021-07-22 ENCOUNTER — Encounter: Payer: Self-pay | Admitting: Cardiovascular Disease

## 2021-07-22 ENCOUNTER — Other Ambulatory Visit: Payer: Self-pay

## 2021-07-22 ENCOUNTER — Ambulatory Visit (INDEPENDENT_AMBULATORY_CARE_PROVIDER_SITE_OTHER): Payer: 59 | Admitting: Cardiovascular Disease

## 2021-07-22 VITALS — BP 108/66 | HR 58 | Ht 68.0 in | Wt 167.4 lb

## 2021-07-22 DIAGNOSIS — I2541 Coronary artery aneurysm: Secondary | ICD-10-CM | POA: Diagnosis not present

## 2021-07-22 DIAGNOSIS — I2584 Coronary atherosclerosis due to calcified coronary lesion: Secondary | ICD-10-CM

## 2021-07-22 DIAGNOSIS — I2510 Atherosclerotic heart disease of native coronary artery without angina pectoris: Secondary | ICD-10-CM

## 2021-07-22 DIAGNOSIS — E785 Hyperlipidemia, unspecified: Secondary | ICD-10-CM | POA: Diagnosis not present

## 2021-07-22 DIAGNOSIS — E7841 Elevated Lipoprotein(a): Secondary | ICD-10-CM

## 2021-07-22 DIAGNOSIS — I240 Acute coronary thrombosis not resulting in myocardial infarction: Secondary | ICD-10-CM

## 2021-07-22 DIAGNOSIS — K7689 Other specified diseases of liver: Secondary | ICD-10-CM

## 2021-07-22 NOTE — Patient Instructions (Addendum)
Medication Instructions:  Increase to 20 mg Rosuvastatin daily   ( check you prescription at home)   *If you need a refill on your cardiac medications before your next appointment, please call your pharmacy*   Lab Work: Not needed If you have labs (blood work) drawn today and your tests are completely normal, you will receive your results only by: [place] (if you have MyChart) OR A paper copy in the mail If you have any lab test that is abnormal or we need to change your treatment, we will call you to review the results.   Testing/Procedures:  Not needed  Follow-Up: At [facility], you and your health needs are our priority.  As part of our continuing mission to provide you with exceptional heart care, we have created designated Provider Care Teams.  These Care Teams include your primary Cardiologist (physician) and Advanced Practice Providers (APPs -  Physician Assistants and Nurse Practitioners) who all work together to provide you with the care you need, when you need it.     Your next appointment:   12 month(s)  The format for your next appointment:   In Person  Provider:   Shelva Majestic, MD

## 2021-07-22 NOTE — Progress Notes (Signed)
Cardiology Office Note    Date:  07/22/2021   ID:  Jonathon Lopez, DOB 1960-08-12, MRN [id number]  PCP:  Dr. Antony Contras  Cardiologist:  Shelva Majestic, MD   F/U cardiology evaluation referred through the courtesy of Dr. 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  History of Present Illness:  Jonathon Lopez is a 61 y.o. male who I saw for initial cardiology evaluation April 16, 2021.  He presents for a follow-up evaluation.   Jonathon Lopez any cardiac history.  He has remained active and runs on a daily basis, typically 3 miles at x4 to 5 miles at a time.  He ran a half marathon earlier this year.  He denies any history of chest pain.  He does note occasional heart skipping occurs mostly when he is resting.  He recently came more cognizant of his diet and excluded aspartame with improvement in his palpitations.  He runs he typically runs a 10-minute mile and he believes his heart rate may increase to around 160 bpm.  He is unaware of exertional palpitations.  He has a history of hyperlipidemia and had been on lovastatin for years, but stopped using treatment in early 2021.  He currently is not on any medications.  He now sees Dr. Marijo File since Dr. Hulan Fess had retired he was referred for CT cardiac scoring.  This was done on February 03, 2021 and showed a calcium score of 33.7, representing 55th percentile for age, gender and ethnicity.  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  A normal-appearing right coronary artery was not identified on this examination and cardiology consultation was recommended.  He was also noted to have a few tiny pulmonary nodules with the largest measuring 2 mm.    When I saw him for my initial evaluation, he was asymptomatic.  With his coronary calcification I recommended he initiate lipid-lowering therapy with lovastatin 20 mg.  I recommended he undergo a 2D echo Doppler study and  coronary CTA.  His echo Doppler study on May 06, 2021 showed hyperdynamic LV function with EF 7075% without wall motion abnormalities.  Coronary CTA demonstrated a calcium score of 19.1, which was 45th percentile for age, sex and race matched controls.  There was minimal calcification of less than 25% in the proximal and mid LAD.  He had an absent RCA.  The left circumflex vessel was superdominant and extended into the right AV groove.  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  On the CT over read, several indeterminate low-attenuation liver lesions were noted.  He subsequently was referred for and a MRI which demonstrated several benign hepatic cysts which correlate with the lesion seen on his cardiac CT.  There was no evidence for hepatic neoplasm or other significant abnormality.  Jonathon Lopez continues to be active.  He denies chest pain or shortness of breath.  He presents for follow-up evaluation.  Past Medical History:  Diagnosis Date   GERD (gastroesophageal reflux disease)    Hyperlipidemia     Past Surgical History:  Procedure Laterality Date   SHOULDER SURGERY  1978   VASECTOMY  2005    Current Medications: Outpatient Medications Prior to Visit  Medication Sig Dispense Refill   rosuvastatin (CRESTOR) 20 MG tablet Take 1 tablet (20 mg total) by mouth daily. 90 tablet 3   metoprolol tartrate (LOPRESSOR) 50 MG tablet  Take one tablet 2 hours before CT. (Patient not taking: Reported on 07/22/2021) 1 tablet 0   No facility-administered medications prior to visit.     Allergies:   Patient has no known allergies.   Social History   Socioeconomic History   Marital status: Married    Spouse name: Not on file   Number of children: Not on file   Years of education: Not on file   Highest education level: Not on file  Occupational History   Not on file  Tobacco Use   Smoking status: Former   Smokeless tobacco: Former     Quit date: 10/25/1995  Substance and Sexual Activity   Alcohol use: No   Drug use: No   Sexual activity: Not on file  Other Topics Concern   Not on file  Social History Narrative   Not on file   Social Determinants of Health   Financial Resource Strain: Not on file  Food Insecurity: Not on file  Transportation Needs: Not on file  Physical Activity: Not on file  Stress: Not on file  Social Connections: Not on file    Social history is notable that he was born in [place].  He moved and went to high school in [place].  He went to [employer] for college and did graduate work at [employer].  He is married for 24 years and has 2 children.  Currently he works in Engineer, technical sales for [employer].  There is remote tobacco use and he quit in 1995.  His remote alcohol use with recovery for 38 years.  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  Family History: His mother died at age 66 and had cancer.  Father died at age 11 and may have had a coronary event in his 42s.  He has a half brother age 72 who is alive and well.  His sister is 56 and stable.  His children are 28 and 21 with a child at [facility].  ROS General: Negative; No fevers, chills, or night sweats;  HEENT: Negative; No changes in vision or hearing, sinus congestion, difficulty swallowing Pulmonary: Negative; No cough, wheezing, shortness of breath, hemoptysis Cardiovascular: Negative; No chest pain, presyncope, syncope, palpitations GI: Negative; No nausea, vomiting, diarrhea, or abdominal pain GU: Negative; No dysuria, hematuria, or difficulty voiding Musculoskeletal: Negative; no myalgias, joint pain, or weakness Hematologic/Oncology: Negative; no easy bruising, bleeding Endocrine: Negative; no heat/cold intolerance; no diabetes Neuro: Negative; no changes in balance, headaches Skin: Negative; No rashes or skin lesions Psychiatric: Negative; No behavioral problems, depression Sleep:  Negative; No snoring, daytime sleepiness, hypersomnolence, bruxism, restless legs, hypnogognic hallucinations, no cataplexy Other comprehensive 14 point system review is negative.   PHYSICAL EXAM:   VS:  BP 108/66    Pulse (!) 58    Ht _0  (1.727 m)    Wt 167 lb 6.4 oz (75.9 kg)    SpO2 100%    BMI 25.45 kg/m      Wt Readings from Last 3 Encounters:  07/22/21 167 lb 6.4 oz (75.9 kg)  04/16/21 163 lb 3.2 oz (74 kg)  11/18/11 152 lb 3.2 oz (69 kg)    General: Alert, oriented, no distress.  Skin: normal turgor, no rashes, warm and dry HEENT: Normocephalic, atraumatic. Pupils equal round and reactive to light; sclera anicteric; extraocular muscles intact;  Nose without nasal septal hypertrophy Mouth/Parynx benign; Mallinpatti scale 3 Neck: No JVD, no carotid bruits; normal carotid  upstroke Lungs: clear to ausculatation and percussion; no wheezing or rales Chest wall: without tenderness to palpitation Heart: PMI not displaced, RRR, s1 s2 normal, 1/6 systolic murmur, no diastolic murmur, no rubs, gallops, thrills, or heaves Abdomen: soft, nontender; no hepatosplenomehaly, BS+; abdominal aorta nontender and not dilated by palpation. Back: no CVA tenderness Pulses 2+ Musculoskeletal: full range of motion, normal strength, no joint deformities Extremities: no clubbing cyanosis or edema, Homan's sign negative  Neurologic: grossly nonfocal; Cranial nerves grossly wnl Psychologic: Normal mood and affect   Studies/Labs Reviewed:   April 16, 2021 ECG (independently read by me):  Sinus rhythm at 65 with PAC  Recent Labs: BMP Latest Ref Rng & Units 04/27/2021  Glucose 70 - 99 mg/dL 87  BUN 8 - 27 mg/dL 11  Creatinine 0.76 - 1.27 mg/dL 1.08  BUN/Creat Ratio 10 - 24 10  Sodium 134 - 144 mmol/L 140  Potassium 3.5 - 5.2 mmol/L 4.4  Chloride 96 - 106 mmol/L 100  CO2 20 - 29 mmol/L 26  Calcium 8.6 - 10.2 mg/dL 9.2     Hepatic Function Latest Ref Rng & Units 04/27/2021  Total Protein  6.0 - 8.5 g/dL 6.6  Albumin 3.8 - 4.9 g/dL 4.4  AST 0 - 40 IU/L 25  ALT 0 - 44 IU/L 18  Alk Phosphatase 44 - 121 IU/L 77  Total Bilirubin 0.0 - 1.2 mg/dL 0.7    No flowsheet data found. No results found for: MCV No results found for: TSH No results found for: HGBA1C   BNP No results found for: BNP  ProBNP No results found for: PROBNP   Lipid Panel     Component Value Date/Time   CHOL 149 04/27/2021 1428   TRIG 62 04/27/2021 1428   HDL 47 04/27/2021 1428   CHOLHDL 3.2 04/27/2021 1428   LDLCALC 89 04/27/2021 1428   LABVLDL 13 04/27/2021 1428     RADIOLOGY: No results found.   Additional studies/ records that were reviewed today include:  I reviewed the records from [facility].  CT cardiac scoring was reviewed.  Laboratory from January 06, 2021 was reviewed which showed a total cholesterol 233, LDL 145, HDL 51, triglycerides 204.  ASSESSMENT:    1. Coronary artery calcification   2. 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   4. Hyperlipidemia with target LDL less than 70   5. Elevated Lp(a)   6. Benign hepatic cysts     PLAN:  Jonathon Lopez is a very pleasant 61 year old gentleman who has been active for most of his life.  Typically runs often 3 miles but at x4 to 5 miles at a time.  Earlier in the year he had run a half marathon without difficulty.  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  He has noticed a rare palpitation.  He has a history of hyperlipidemia anemia and had been on lipid-lowering therapy for over 20 years but stopped treatment in early 2021.  When I initially saw him, I reviewed his most recent lipid panel which is elevated and suggests a mixed hyperlipidemic picture.  LDL cholesterol was 145 and triglycerides 204 with total cholesterol 233 and HDL 51.  I reviewed his CT cardiac scoring evaluation which showed  calcium score of 33.7 and raised concern for a possible hypoplastic RCA.  He subsequently underwent a 2D echo Doppler study which showed  hyperdynamic LV function without wall motion abnormality.  I recommended he undergo coronary CTA and I reviewed this with him in detail.  This revealed calcium score of 19.1 representing 45 percentile for age, sex and race matched control.  There was minimal LAD plaque less than 25%.  His RCA was absent and the left circumflex vessel was superdominant extending into the right AV groove.  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  On the CT over read there was several low-attenuation lesions in the right hepatic lobe of the liver.  On subsequent MRI these were consistent with several benign hepatic cysts.  There is no evidence for hepatic neoplasm or other abnormality.  Jonathon Lopez remains asymptomatic.  Her swain had initially recommended rosuvastatin 10 mg but in light of his coronary calcification I have recommended 20 mg with target LDL less than 70.  His most recent LDL cholesterol on July 16, 2021 was 89.  I ordered him to undergo an LP(a) which was significantly elevated at 209.7.  This is another reason to be very aggressive with lipid therapy since LP(a) is an independent marker for potential vulnerable plaque.  The future, if there are issues with statin therapy he may be a candidate for Repatha or inclisiran.  He will follow-up with Dr. Marijo File.  I will see him in 1 year for reevaluation or sooner as needed.    Medication Adjustments/Labs and Tests Ordered: Current medicines are reviewed at length with the patient today.  Concerns regarding medicines are outlined above.  Medication changes, Labs and Tests ordered today are listed in the Patient Instructions below. Patient Instructions  Medication Instructions:  Increase to 20 mg Rosuvastatin daily   ( check you prescription at home)   *If  you need a refill on your cardiac medications before your next appointment, please call your pharmacy*   Lab Work: Not needed If you have labs (blood work) drawn today and your tests are completely normal, you will receive your results only by: [facility] (if you have MyChart) OR A paper copy in the mail If you have any lab test that is abnormal or we need to change your treatment, we will call you to review the results.   Testing/Procedures:  Not needed  Follow-Up: At [facility], you and your health needs are our priority.  As part of our continuing mission to provide you with exceptional heart care, we have created designated Provider Care Teams.  These Care Teams include your primary Cardiologist (physician) and Advanced Practice Providers (APPs -  Physician Assistants and Nurse Practitioners) who all work together to provide you with the care you need, when you need it.     Your next appointment:   12 month(s)  The format for your next appointment:   In Person  Provider:   Shelva Majestic, MD       Signed, Shelva Majestic, MD  07/22/2021 10:15 [place] [address], [place], [place], [place]  [address] Phone: [phone]

## 2021-08-19 ENCOUNTER — Ambulatory Visit: Payer: Self-pay | Admitting: Surgery

## 2021-10-09 ENCOUNTER — Ambulatory Visit (INDEPENDENT_AMBULATORY_CARE_PROVIDER_SITE_OTHER): Payer: 59 | Admitting: Physician Assistant

## 2021-10-09 ENCOUNTER — Telehealth: Payer: Self-pay | Admitting: *Deleted

## 2021-10-09 DIAGNOSIS — Z01810 Encounter for preprocedural cardiovascular examination: Secondary | ICD-10-CM | POA: Diagnosis not present

## 2021-10-09 NOTE — Telephone Encounter (Signed)
? ? ?  Name: Jonathon Lopez  ?DOB: 1961/06/03  ?MRN: [id number] ? ?Primary Cardiologist: Nicki Guadalajara, MD ? ? ?Preoperative team, please contact this patient and set up a phone call appointment for further preoperative risk assessment. Please obtain consent and complete medication review. Thank you for your help. ? ?I confirm that guidance regarding antiplatelet and oral anticoagulation therapy has been completed and, if necessary, noted below. ? ? ? ?Eula Listen, PA-C ?10/09/2021, 10:18 AM ?[facility] ?[address] ?[place], [place] [address] ? ? ?

## 2021-10-09 NOTE — Progress Notes (Signed)
? ?Virtual Visit via Telephone Note  ? ?This visit type was conducted due to national recommendations for restrictions regarding the COVID-19 Pandemic (e.g. social distancing) in an effort to limit this patient's exposure and mitigate transmission in our community.  Due to his co-morbid illnesses, this patient is at least at moderate risk for complications without adequate follow up.  This format is felt to be most appropriate for this patient at this time.  The patient did not have access to video technology/had technical difficulties with video requiring transitioning to audio format only (telephone).  All issues noted in this document were discussed and addressed.  No physical exam could be performed with this format.  Please refer to the patient's chart for his  consent to telehealth for [facility]. ? ?Evaluation Performed:  Preoperative cardiovascular risk assessment ?_____________  ? ?Date:  10/09/2021  ? ?Patient ID:  Jonathon Lopez, DOB 1960/07/17, MRN [id number] ?Patient Location:  ?Home ?Provider location:   ?Office ? ?Primary Care Provider:  Tally Joe, MD ?Primary Cardiologist:  Nicki Guadalajara, MD ? ?Chief Complaint  ?  ?61 y.o. y/o male with a h/o nonobstructive CAD with absent RCA and super dominant LCx extending into the right AV groove and coronary artery fistula between the LCx and LAD, along with HLD who is pending posterior neck mass excision scheduled for 10/16/2021, and presents today for telephonic preoperative cardiovascular risk assessment. ? ?Past Medical History  ?  ?Past Medical History:  ?Diagnosis Date  ? GERD (gastroesophageal reflux disease)   ? Hyperlipidemia   ? ?Past Surgical History:  ?Procedure Laterality Date  ? SHOULDER SURGERY  1978  ? VASECTOMY  2005  ? ? ?Allergies ? ?No Known Allergies ? ?History of Present Illness  ?  ?Jonathon Lopez is a 61 y.o. male who presents via audio/video conferencing for a telehealth visit today.  Pt was last seen in cardiology clinic on 07/22/2021  by Dr. Tresa Endo.  At that time Jonathon Lopez was doing well, without symptoms of angina or decompensation.  He is very active at baseline, running on a daily basis, having previously ran a half marathon.  The patient is now pending posterior neck mass excision, scheduled for 10/16/2021.  Since his last visit, he has done very well and has been without symptoms of angina or decompensation.  He remains very active, running 10-12 miles per week without cardiac limitation.  ? ? ?Home Medications  ?  ?Prior to Admission medications   ?Medication Sig Start Date End Date Taking? Authorizing Provider  ?rosuvastatin (CRESTOR) 20 MG tablet Take 1 tablet (20 mg total) by mouth daily. 04/16/21 07/22/21  Lennette Bihari, MD  ? ? ?Physical Exam  ?  ?Vital Signs:  Jonathon Lopez does not have vital signs available for review today. ? ?Given telephonic nature of communication, physical exam is limited. ?AAOx3. NAD. Normal affect.  Speech and respirations are unlabored. ? ?Accessory Clinical Findings  ?  ?None ? ?Assessment & Plan  ?  ?1.  Preoperative Cardiovascular Risk Assessment: The patient affirms he has been doing well without any new cardiac symptoms. They are able to achieve > 4 METS without cardiac limitations. RCRI: low risk for noncardiac procedure.  Therefore, based on ACC/AHA guidelines, the patient would be at acceptable risk for the planned procedure without further cardiovascular testing. The patient was advised that if he develops new symptoms prior to surgery to contact our office to arrange for a follow-up visit, and he verbalized understanding. ? ? ? ?A copy  of this note will be routed to requesting surgeon. ? ?Time:   ?Today, I have spent 6 minutes with the patient with telehealth technology discussing medical history, symptoms, and management plan.   ? ? ?Eula Listen, PA-C ? ?10/09/2021, 2:46 PM ? ?

## 2021-10-09 NOTE — Telephone Encounter (Signed)
? ?  Pre-operative Risk Assessment  ?  ?Patient Name: Jonathon Lopez  ?DOB: 1961-04-10 ?MRN: [id number]  ? ?  ? ?Request for Surgical Clearance   ? ?Procedure:   Excision of posterior neck mass ? ?Date of Surgery:  Clearance 10/16/21                              ?   ?Surgeon:  Not listed ?Surgeon's Group or Practice Name:  [facility] ?Phone number:  [phone] ?Fax number:  [phone] Attn Ethlyn Gallery, CMA ?  ?Type of Clearance Requested:   ?- Medical  ?  ?Type of Anesthesia:  General  ?  ?Additional requests/questions:   ? ?Signed, ?Verneda Skill L   ?10/09/2021, 10:05 AM   ?

## 2021-10-09 NOTE — Telephone Encounter (Signed)
Spoke with patient and scheduled him for a telehealth pre-op clearance appt for today at 2:40 PM. Consent in and med reviewed. ?

## 2021-10-09 NOTE — Telephone Encounter (Signed)
?  Patient Consent for Virtual Visit  ? ? ?   ? ?Jonathon Lopez has provided verbal consent on 10/09/2021 for a virtual visit (video or telephone). ? ? ?CONSENT FOR VIRTUAL VISIT FOR:  Jonathon Lopez  ?By participating in this virtual visit I agree to the following: ? ?I hereby voluntarily request, consent and authorize [facility] and its employed or contracted physicians, physician assistants, nurse practitioners or other licensed health care professionals (the Practitioner), to provide me with telemedicine health care services (the ?Services") as deemed necessary by the treating Practitioner. I acknowledge and consent to receive the Services by the Practitioner via telemedicine. I understand that the telemedicine visit will involve communicating with the Practitioner through live audiovisual communication technology and the disclosure of certain medical information by electronic transmission. I acknowledge that I have been given the opportunity to request an in-person assessment or other available alternative prior to the telemedicine visit and am voluntarily participating in the telemedicine visit. ? ?I understand that I have the right to withhold or withdraw my consent to the use of telemedicine in the course of my care at any time, without affecting my right to future care or treatment, and that the Practitioner or I may terminate the telemedicine visit at any time. I understand that I have the right to inspect all information obtained and/or recorded in the course of the telemedicine visit and may receive copies of available information for a reasonable fee.  I understand that some of the potential risks of receiving the Services via telemedicine include:  ?Delay or interruption in medical evaluation due to technological equipment failure or disruption; ?Information transmitted may not be sufficient (e.g. poor resolution of images) to allow for appropriate medical decision making by the Practitioner; and/or   ?In rare instances, security protocols could fail, causing a breach of personal health information. ? ?Furthermore, I acknowledge that it is my responsibility to provide information about my medical history, conditions and care that is complete and accurate to the best of my ability. I acknowledge that Practitioner's advice, recommendations, and/or decision may be based on factors not within their control, such as incomplete or inaccurate data provided by me or distortions of diagnostic images or specimens that may result from electronic transmissions. I understand that the practice of medicine is not an exact science and that Practitioner makes no warranties or guarantees regarding treatment outcomes. I acknowledge that a copy of this consent can be made available to me via my patient portal ([facility]), or I can request a printed copy by calling the office of [facility].   ? ?I understand that my insurance will be billed for this visit.  ? ?I have read or had this consent read to me. ?I understand the contents of this consent, which adequately explains the benefits and risks of the Services being provided via telemedicine.  ?I have been provided ample opportunity to ask questions regarding this consent and the Services and have had my questions answered to my satisfaction. ?I give my informed consent for the services to be provided through the use of telemedicine in my medical care ? ?  ?

## 2022-06-05 IMAGING — MR MR ABDOMEN WO/W CM
12 of 17 series · 31 of 48 positions shown · IV contrast (multihance)
Comparison: Cardiac CT on 04/28/2021

CLINICAL DATA: Indeterminate liver lesions on recent cardiac CT.

EXAM:
MRI ABDOMEN WITHOUT AND WITH CONTRAST
TECHNIQUE: Multiplanar multisequence MR imaging of the abdomen was performed
both before and after the administration of intravenous contrast.
CONTRAST:  14mL MULTIHANCE GADOBENATE DIMEGLUMINE 529 MG/ML IV SOLN

[Series 3: cor haste · coronal · 5.0mm · 0.68mm/px · 2 of 32 slices shown]
[im 1/32]
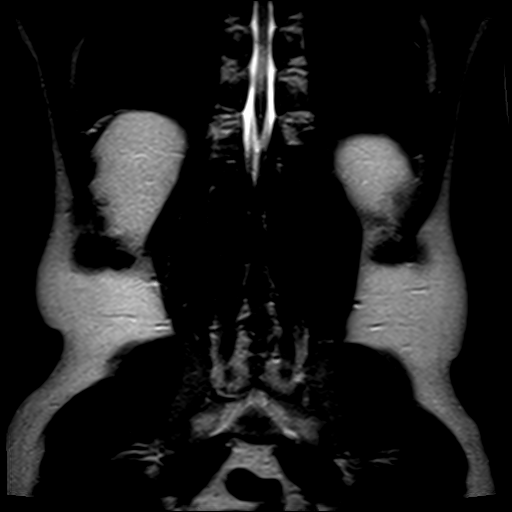
[im 32/32]
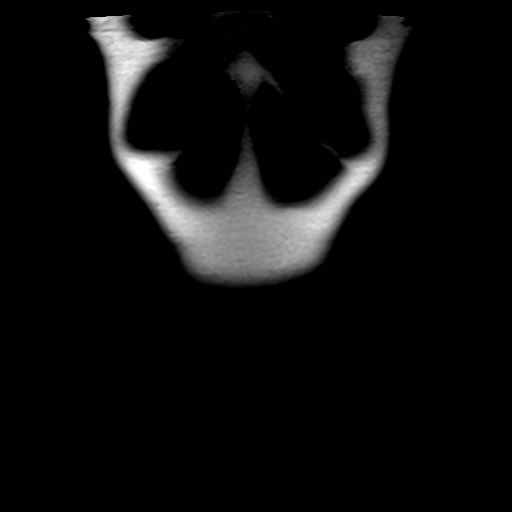

[Series 4: axial haste · axial · 6.0mm · 0.68mm/px · z∈[-158,+53]mm · 2 of 33 slices shown]
[im 1/33]
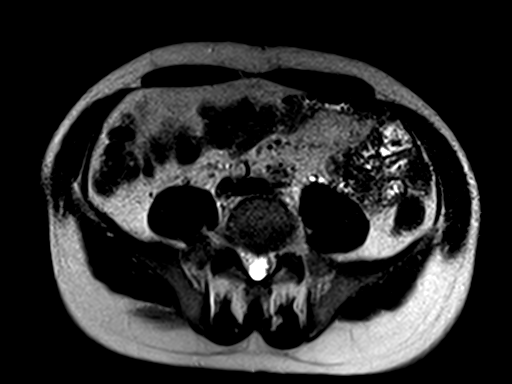
[im 33/33]
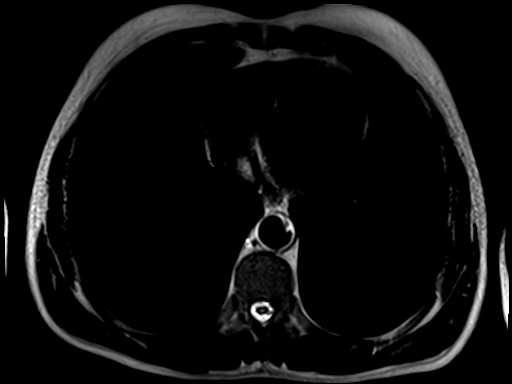

[Series 5: T1 · axial · 6.0mm · 0.68mm/px · z∈[-158,+53]mm · 4 of 66 slices shown]
[im 1/66]
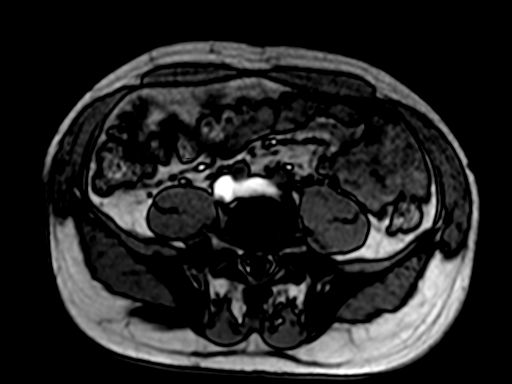
[im 22/66]
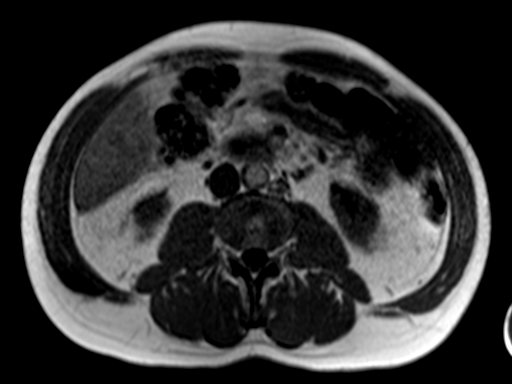
[im 44/66]
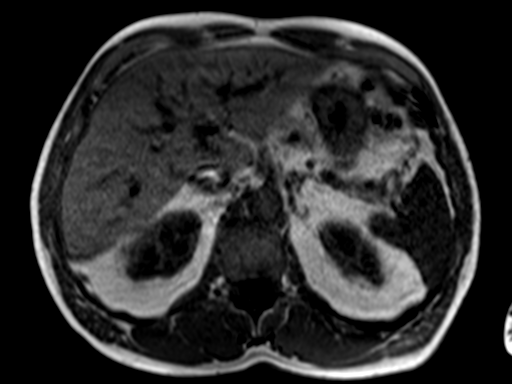
[im 66/66]
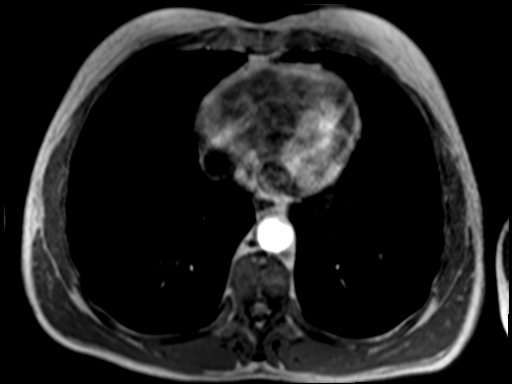

[Series 6: T2 fat-sat · axial · 6.0mm · 1.09mm/px · z∈[-118,+105]mm · 2 of 32 slices shown]
[im 1/32]
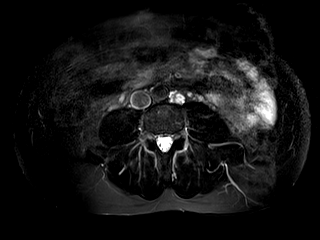
[im 32/32]
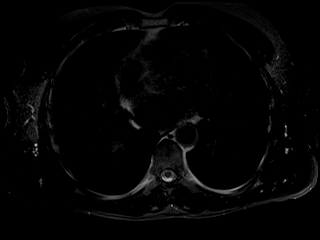

[Series 7: ep2d_diff_b50_500_800_p2_trig · axial · 6.0mm · 1.82mm/px · z∈[-114,+109]mm · 5 of 96 slices shown]
[im 1/96]
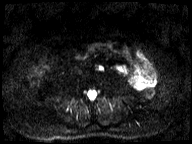
[im 24/96]
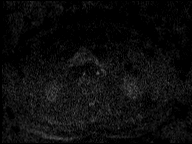
[im 48/96]
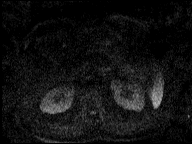
[im 72/96]
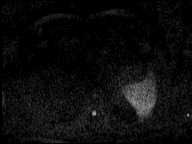
[im 96/96]
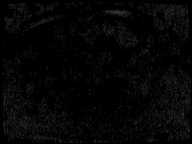

[Series 8: ep2d_diff_b50_500_800_p2_trig_adc · axial · 6.0mm · 1.82mm/px · 1 of 32 slices shown]
[im 1/32]
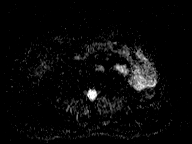

[Series 9: bSSFP · axial · 4.0mm · 0.68mm/px · z∈[-172,+68]mm · 2 of 61 slices shown]
[im 1/61]
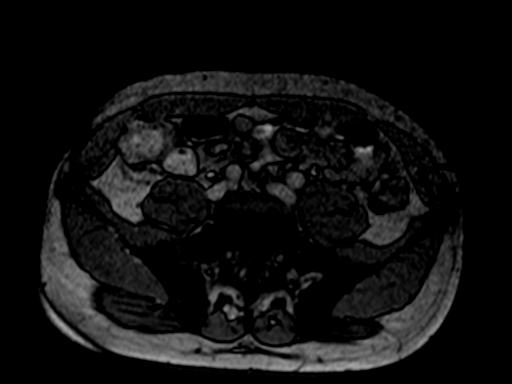
[im 61/61]
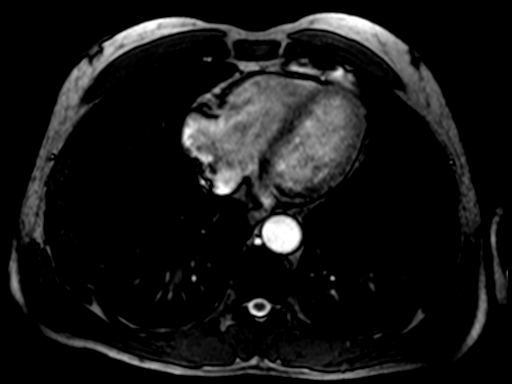

[Series 10: T1 dynamic · axial · non-contrast · 2.5mm · 0.74mm/px · z∈[-161,+56]mm · 3 of 88 slices shown]
[im 1/88]
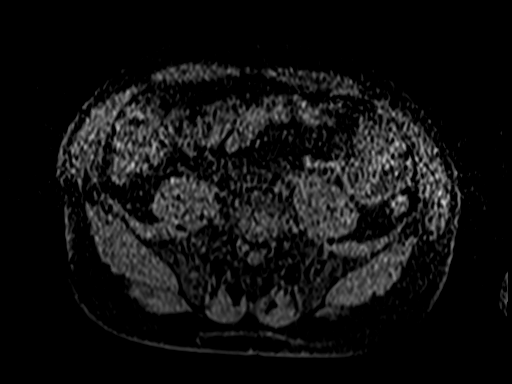
[im 44/88]
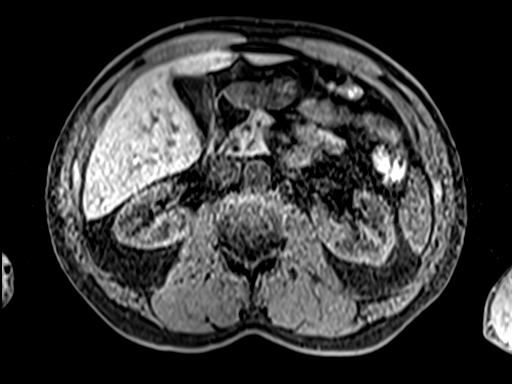
[im 88/88]
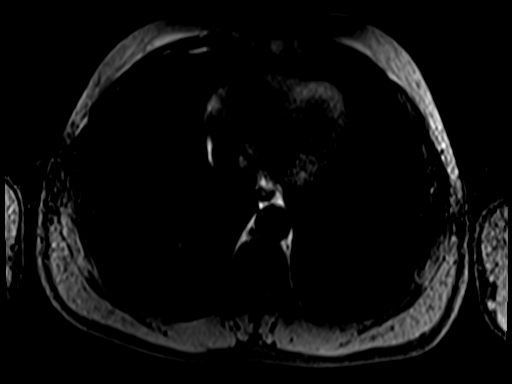

[Series 11: T1 dynamic post-contrast · axial · 2.5mm · 0.74mm/px · z∈[-161,+56]mm · 3 of 88 slices shown (1 of 4)]
[im 1/88]
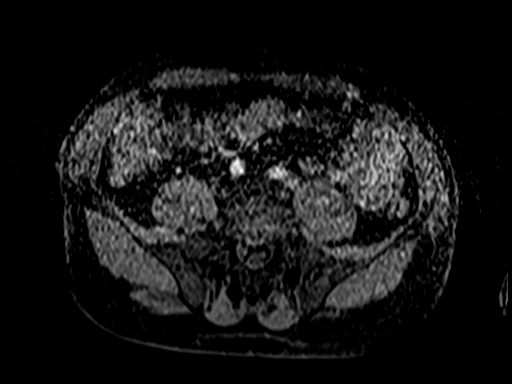
[im 44/88]
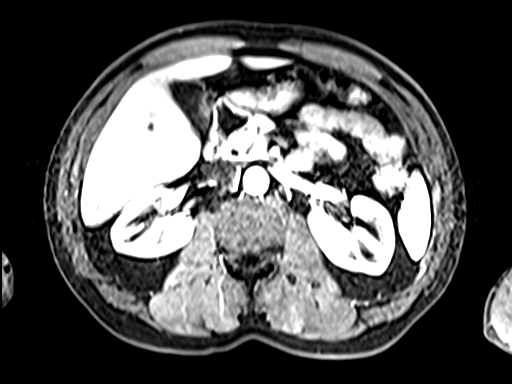
[im 88/88]
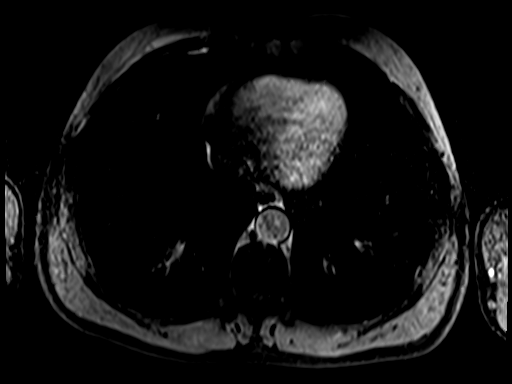

[Series 12: T1 dynamic post-contrast · axial · 2.5mm · 0.74mm/px · z∈[-161,+56]mm · 3 of 88 slices shown (2 of 4)]
[im 1/88]
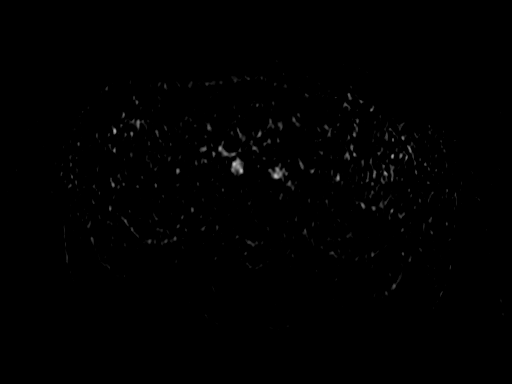
[im 44/88]
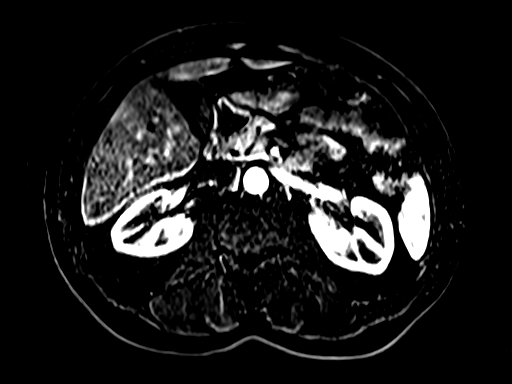
[im 88/88]
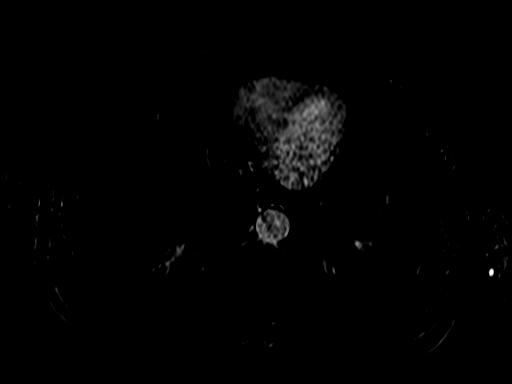

[Series 13: T1 dynamic post-contrast · axial · 2.5mm · 0.74mm/px · z∈[-161,+56]mm · 3 of 88 slices shown (3 of 4)]
[im 1/88]
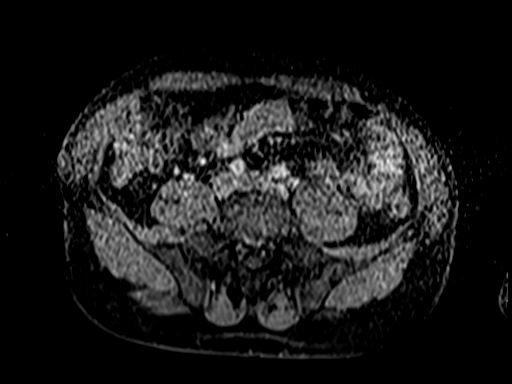
[im 44/88]
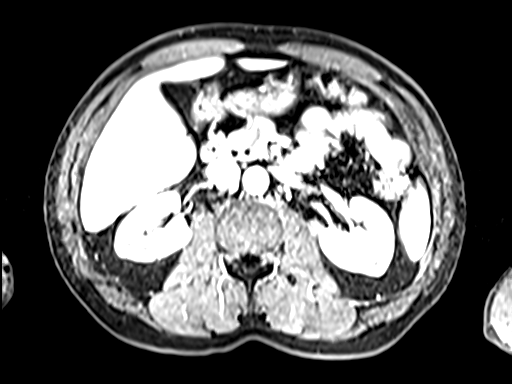
[im 88/88]
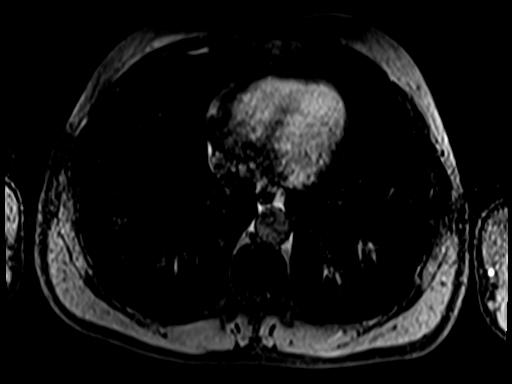

[Series 14: T1 dynamic post-contrast · axial · 2.5mm · 0.74mm/px · 1 of 88 slices shown (4 of 4)]
[im 1/88]
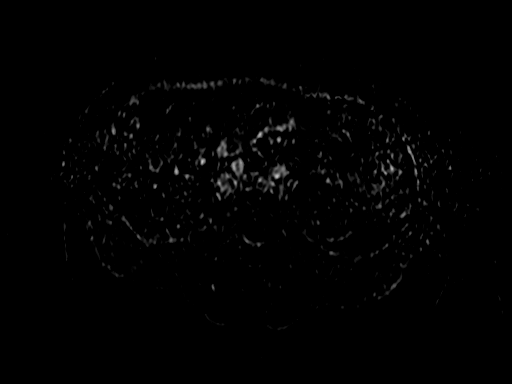

[31 of 48 positions shown; findings below may reference images not displayed]

FINDINGS: Lower chest: No acute findings.

Hepatobiliary: Several tiny hepatic cysts are seen, mostly in the
right hepatic lobe, which correlate with the lesion seen on recent
cardiac CT. No hepatic masses are identified. Gallbladder is
unremarkable. No evidence of biliary ductal dilatation. No hepatic
masses identified.

Pancreas:  No mass or inflammatory changes.

Spleen:  Within normal limits in size and appearance.

Adrenals/Urinary Tract: No masses identified. No evidence of
hydronephrosis.

Stomach/Bowel: Visualized portion unremarkable.

Vascular/Lymphatic: No pathologically enlarged lymph nodes
identified. No acute vascular findings.

Other:  None.

Musculoskeletal:  No suspicious bone lesions identified.
IMPRESSION: Several benign hepatic cysts, which correlate with the lesions seen
on recent cardiac CT.

No evidence of hepatic neoplasm or other significant abnormality.

## 2022-07-26 ENCOUNTER — Encounter: Payer: Self-pay | Admitting: Cardiovascular Disease

## 2022-07-26 ENCOUNTER — Ambulatory Visit: Payer: 59 | Attending: Cardiovascular Disease | Admitting: Cardiovascular Disease

## 2022-07-26 DIAGNOSIS — I240 Acute coronary thrombosis not resulting in myocardial infarction: Secondary | ICD-10-CM

## 2022-07-26 DIAGNOSIS — I2510 Atherosclerotic heart disease of native coronary artery without angina pectoris: Secondary | ICD-10-CM

## 2022-07-26 DIAGNOSIS — Z01810 Encounter for preprocedural cardiovascular examination: Secondary | ICD-10-CM

## 2022-07-26 DIAGNOSIS — I2541 Coronary artery aneurysm: Secondary | ICD-10-CM | POA: Diagnosis not present

## 2022-07-26 DIAGNOSIS — I2584 Coronary atherosclerosis due to calcified coronary lesion: Secondary | ICD-10-CM

## 2022-07-26 DIAGNOSIS — E7841 Elevated Lipoprotein(a): Secondary | ICD-10-CM | POA: Diagnosis not present

## 2022-07-26 DIAGNOSIS — E785 Hyperlipidemia, unspecified: Secondary | ICD-10-CM | POA: Diagnosis not present

## 2022-07-26 MED ORDER — EZETIMIBE 10 MG PO TABS: 10.0000 mg | ORAL_TABLET | Freq: Every day | ORAL | 3 refills | Status: DC

## 2022-07-26 NOTE — Progress Notes (Signed)
Cardiology Office Note    Date:  07/31/2022   ID:  Jonathon Lopez, DOB Nov 14, 1960, MRN [id number]  PCP:  Dr. Antony Contras  Cardiologist:  Shelva Majestic, MD   12 month F/U cardiology evaluation initially referred through the courtesy of Dr. 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  History of Present Illness:  Jonathon Lopez is a 62 y.o. male who I saw for initial cardiology evaluation April 16, 2021 and last saw him on July 22, 2021.  He presents for yearly follow-up evaluation.  [name] any cardiac history.  He has remained active and runs on a daily basis, typically 3 miles at x4 to 5 miles at a time.  He ran a half marathon earlier this year.  He denies any history of chest pain.  He does note occasional heart skipping occurs mostly when he is resting.  He recently came more cognizant of his diet and excluded aspartame with improvement in his palpitations.  He runs he typically runs a 10-minute mile and he believes his heart rate may increase to around 160 bpm.  He is unaware of exertional palpitations.  He has a history of hyperlipidemia and had been on lovastatin for years, but stopped using treatment in early 2021.  He currently is not on any medications.  He now sees Dr. Marijo File since Dr. Hulan Fess had retired he was referred for CT cardiac scoring.  This was done on February 03, 2021 and showed a calcium score of 33.7, representing 55th percentile for age, gender and ethnicity.  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  A normal-appearing right coronary artery was not identified on this examination and cardiology consultation was recommended.  He was also noted to have a few tiny pulmonary nodules with the largest measuring 2 mm.    When I saw him for my initial evaluation, he was asymptomatic.  With his coronary calcification I recommended he initiate lipid-lowering therapy with lovastatin 20  mg.  I recommended he undergo a 2D echo Doppler study and coronary CTA.  His echo Doppler study on May 06, 2021 showed hyperdynamic LV function with EF 7075% without wall motion abnormalities.  Coronary CTA demonstrated a calcium score of 19.1, which was 45th percentile for age, sex and race matched controls.  There was minimal calcification of less than 25% in the proximal and mid LAD.  He had an absent RCA.  The left circumflex vessel was superdominant and extended into the right AV groove.  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  On the CT over read, several indeterminate low-attenuation liver lesions were noted.  He subsequently was referred for and a MRI which demonstrated several benign hepatic cysts which correlate with the lesion seen on his cardiac CT.  There was no evidence for hepatic neoplasm or other significant abnormality.  I last saw him on July 22, 2021.  Jonathon Lopez continued to be active.  He denied chest pain or shortness of breath.    Since I last saw him, Jonathon Lopez has remained stable.  He is still running approximately 15 to 20 miles per week and usually runs 4 to 5 days/week.  Remotely, he had experienced PACs but these stopped once he discontinued diet drinks.  He has been taking rosuvastatin 20 mg for hyperlipidemia.  Laboratory in November 2022 showed a cholesterol of 149, HDL 47,  LDL 89.  Most recent laboratory at [facility] on February 12, 2022 showed total cholesterol 154, HDL 53, LDL 86 and triglycerides 78.  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  He presents for follow-up evaluation.  Past Medical History:  Diagnosis Date   GERD (gastroesophageal reflux disease)    Hyperlipidemia     Past Surgical History:  Procedure Laterality Date   SHOULDER SURGERY  1978   VASECTOMY  2005    Current Medications: Outpatient Medications Prior to Visit  Medication Sig Dispense Refill   rosuvastatin  (CRESTOR) 20 MG tablet Take 1 tablet (20 mg total) by mouth daily. 90 tablet 3   rosuvastatin (CRESTOR) 20 MG tablet Take 20 mg by mouth daily.     No facility-administered medications prior to visit.     Allergies:   Patient has no known allergies.   Social History   Socioeconomic History   Marital status: Married    Spouse name: Not on file   Number of children: Not on file   Years of education: Not on file   Highest education level: Not on file  Occupational History   Not on file  Tobacco Use   Smoking status: Former   Smokeless tobacco: Former    Quit date: 10/25/1995  Substance and Sexual Activity   Alcohol use: No   Drug use: No   Sexual activity: Not on file  Other Topics Concern   Not on file  Social History Narrative   Not on file   Social Determinants of Health   Financial Resource Strain: Not on file  Food Insecurity: Not on file  Transportation Needs: Not on file  Physical Activity: Not on file  Stress: Not on file  Social Connections: Not on file    Social history is notable that he was born in [place], [place].  He moved and went to high school in [place].  He went to [employer] for college and did graduate work at [employer].  He is married for 24 years and has 2 children.  Currently he works in Engineer, technical sales for [employer].  There is remote tobacco use and he quit in 1995.  His has remote alcohol use with recovery for 38 years.  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  Family History: His mother died at age 71 and had cancer.  Father died at age 39 and may have had a coronary event in his 97s.  He has a half brother age 11 who is alive and well.  His sister is 70 and stable.  His children are 18 and 21 with a child at [facility].  ROS General: Negative; No fevers, chills, or night sweats;  HEENT: Negative; No changes in vision or hearing, sinus congestion, difficulty swallowing Pulmonary: Negative; No cough, wheezing,  shortness of breath, hemoptysis Cardiovascular: Negative; No chest pain, presyncope, syncope, palpitations GI: Negative; No nausea, vomiting, diarrhea, or abdominal pain GU: Negative; No dysuria, hematuria, or difficulty voiding Musculoskeletal: Negative; no myalgias, joint pain, or weakness Hematologic/Oncology: Negative; no easy bruising, bleeding Endocrine: Negative; no heat/cold intolerance; no diabetes Neuro: Negative; no changes in balance, headaches Skin: Negative; No rashes or skin lesions Psychiatric: Negative; No behavioral problems, depression Sleep: Negative; No snoring, daytime sleepiness, hypersomnolence, bruxism, restless legs, hypnogognic hallucinations, no cataplexy Other comprehensive 14 point system review is negative.   PHYSICAL EXAM:   VS:  BP 128/67 (BP Location: Left Arm, Patient Position: Sitting, Cuff  Size: Normal)   Pulse 63   Ht 5' 8"$  (1.727 m)   Wt 166 lb 3.2 oz (75.4 kg)   SpO2 98%   BMI 25.27 kg/m     Repeat blood pressure by me was 112/68  Wt Readings from Last 3 Encounters:  07/26/22 166 lb 3.2 oz (75.4 kg)  07/22/21 167 lb 6.4 oz (75.9 kg)  04/16/21 163 lb 3.2 oz (74 kg)    General: Alert, oriented, no distress.  Skin: normal turgor, no rashes, warm and dry HEENT: Normocephalic, atraumatic. Pupils equal round and reactive to light; sclera anicteric; extraocular muscles intact;  Nose without nasal septal hypertrophy Mouth/Parynx benign; Mallinpatti scale 3 Neck: No JVD, no carotid bruits; normal carotid upstroke Lungs: clear to ausculatation and percussion; no wheezing or rales Chest wall: without tenderness to palpitation Heart: PMI not displaced, RRR, s1 s2 normal, 1/6 systolic murmur, no diastolic murmur, no rubs, gallops, thrills, or heaves Abdomen: soft, nontender; no hepatosplenomehaly, BS+; abdominal aorta nontender and not dilated by palpation. Back: no CVA tenderness Pulses 2+ Musculoskeletal: full range of motion, normal strength, no  joint deformities Extremities: no clubbing cyanosis or edema, Homan's sign negative  Neurologic: grossly nonfocal; Cranial nerves grossly wnl Psychologic: Normal mood and affect    Studies/Labs Reviewed:   July 26, 2022 ECG (independently read by me): NSR at 63, no ectopy  April 16, 2021 ECG (independently read by me):  Sinus rhythm at 65 with PAC  Recent Labs:    Latest Ref Rng & Units 04/27/2021    2:28 PM  BMP  Glucose 70 - 99 mg/dL 87   BUN 8 - 27 mg/dL 11   Creatinine 0.76 - 1.27 mg/dL 1.08   BUN/Creat Ratio 10 - 24 10   Sodium 134 - 144 mmol/L 140   Potassium 3.5 - 5.2 mmol/L 4.4   Chloride 96 - 106 mmol/L 100   CO2 20 - 29 mmol/L 26   Calcium 8.6 - 10.2 mg/dL 9.2         Latest Ref Rng & Units 04/27/2021    2:28 PM  Hepatic Function  Total Protein 6.0 - 8.5 g/dL 6.6   Albumin 3.8 - 4.9 g/dL 4.4   AST 0 - 40 IU/L 25   ALT 0 - 44 IU/L 18   Alk Phosphatase 44 - 121 IU/L 77   Total Bilirubin 0.0 - 1.2 mg/dL 0.7         No data to display         No results found for: "MCV" No results found for: "TSH" No results found for: "HGBA1C"   BNP No results found for: "BNP"  ProBNP No results found for: "PROBNP"   Lipid Panel     Component Value Date/Time   CHOL 149 04/27/2021 1428   TRIG 62 04/27/2021 1428   HDL 47 04/27/2021 1428   CHOLHDL 3.2 04/27/2021 1428   LDLCALC 89 04/27/2021 1428   LABVLDL 13 04/27/2021 1428     RADIOLOGY: No results found.   Additional studies/ records that were reviewed today include:  I reviewed the records from [facility].  CT cardiac scoring was reviewed.  Laboratory from January 06, 2021 was reviewed which showed a total cholesterol 233, LDL 145, HDL 51, triglycerides 204.  ASSESSMENT:    1. Coronary artery calcification   2. 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  4. Elevated Lp(a)   5.  Hyperlipidemia with target LDL less than 50     PLAN:  Jonathon Lopez is a very pleasant 89 -year-old gentleman who has been active for most of his life.  Typically runs often 3 miles but at x4 to 5 miles at a time.  In 2022  he had run a half marathon without difficulty and was doing CrossFit training without chest discomfort.  He has noticed a rare palpitation.  He has a history of hyperlipidemia and had been on lipid-lowering therapy for over 20 years but stopped treatment in early 2021.  When I initially saw him, I reviewed his most recent lipid panel which is elevated and suggests a mixed hyperlipidemic picture.  LDL cholesterol was 145 and triglycerides 204 with total cholesterol 233 and HDL 51.  I reviewed his CT cardiac scoring evaluation which showed calcium score of 33.7 and raised concern for a possible hypoplastic RCA.  He subsequently underwent a 2D echo Doppler study which showed hyperdynamic LV function without wall motion abnormality.  I recommended he undergo coronary CTA and I reviewed this with him in detail.  This revealed calcium score of 19.1 representing 45 percentile for age, sex and race matched control.  There was minimal LAD plaque less than 25%.  His RCA was absent and the left circumflex vessel was superdominant extending into the right AV groove.  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  On the CT over read there was several low-attenuation lesions in the right hepatic lobe of the liver.  On subsequent MRI these were consistent with several benign hepatic cysts.  There is no evidence for hepatic neoplasm or other abnormality.  Jonathon Lopez has remained asymptomatic.  Dr Rockwell Germany had initially recommended rosuvastatin 10 mg but in light of his coronary calcification I  recommended 20 mg with target LDL less than 70.  His most recent LDL cholesterol on July 16, 2021 was 89.  I ordered him to undergo an LP(a) which  was significantly elevated at 209.7.  I reviewed with him data regarding elevated LP(a) which is an independent risk factor for CAD and potential only susceptible for development of vulnerable plaque.  With his elevated LP(a) I have suggested target LDL be less than 50 and at present we will add Zetia to his current dose of rosuvastatin.  I also discussed potential PCSK9 inhibition which can reduce LP(a) by 26 to 30% and I have discussed with him current ongoing clinical trials with new therapy currently not FDA approved.  I have recommended that he have repeat laboratory at [facility] which is scheduled to be done in 4 months.  I will see him in 1 year for reevaluation or sooner as needed.  Medication Adjustments/Labs and Tests Ordered: Current medicines are reviewed at length with the patient today.  Concerns regarding medicines are outlined above.  Medication changes, Labs and Tests ordered today are listed in the Patient Instructions below. Patient Instructions  Medication Instructions:  Your physician has recommended you make the following change in your medication:  START: Zetia 72m daily.  *If you need a refill on your cardiac medications before your next appointment, please call your pharmacy*   Lab Work: NONE If you have labs (blood work) drawn today and your tests are completely normal, you will receive your results only by: [place](if you have MyChart) OR A paper copy in the mail If you have  any lab test that is abnormal or we need to change your treatment, we will call you to review the results.   Testing/Procedures: NONE    Follow-Up: At [facility], you and your health needs are our priority.  As part of our continuing mission to provide you with exceptional heart care, we have created designated Provider Care Teams.  These Care Teams include your primary Cardiologist (physician) and Advanced Practice Providers (APPs -  Physician Assistants and Nurse  Practitioners) who all work together to provide you with the care you need, when you need it.  We recommend signing up for the patient portal called "MyChart".  Sign up information is provided on this After Visit Summary.  MyChart is used to connect with patients for Virtual Visits (Telemedicine).  Patients are able to view lab/test results, encounter notes, upcoming appointments, etc.  Non-urgent messages can be sent to your provider as well.   To learn more about what you can do with MyChart, go to [email or web address].    Your next appointment:   1 year(s)  Provider:   Shelva Majestic, MD      Signed, Shelva Majestic, MD  07/31/2022 3:40 PM    [facility] [address], [place], [place], [place]  [address] Phone: [phone]

## 2022-07-26 NOTE — Patient Instructions (Signed)
Medication Instructions:  Your physician has recommended you make the following change in your medication:  START: Zetia 70m daily.  *If you need a refill on your cardiac medications before your next appointment, please call your pharmacy*   Lab Work: NONE If you have labs (blood work) drawn today and your tests are completely normal, you will receive your results only by: [place](if you have MyChart) OR A paper copy in the mail If you have any lab test that is abnormal or we need to change your treatment, we will call you to review the results.   Testing/Procedures: NONE    Follow-Up: [facility] and your [facility] are our [facility].  As part of our continuing mission to provide you with exceptional heart care, we have created designated Provider Care Teams.  These Care Teams include your primary Cardiologist (physician) and Advanced Practice Providers (APPs -  Physician Assistants and Nurse Practitioners) who all work together to provide you with the care you need, when you need it.  We recommend signing up for the patient portal called "MyChart".  Sign up information is provided on this After Visit Summary.  MyChart is used to connect with patients for Virtual Visits (Telemedicine).  Patients are able to view lab/test results, encounter notes, upcoming appointments, etc.  Non-urgent messages can be sent to your provider as well.   To learn more about what you can do with MyChart, go to [email or web address]    Your next appointment:   1 year(s)  Provider:   TShelva Majestic MD

## 2022-07-31 ENCOUNTER — Encounter: Payer: Self-pay | Admitting: Cardiovascular Disease

## 2022-10-14 ENCOUNTER — Encounter: Payer: Self-pay | Admitting: Cardiovascular Disease

## 2022-10-15 ENCOUNTER — Telehealth: Payer: Self-pay

## 2022-10-15 ENCOUNTER — Other Ambulatory Visit: Payer: Self-pay | Admitting: Family Medicine

## 2022-10-15 DIAGNOSIS — R413 Other amnesia: Secondary | ICD-10-CM

## 2022-10-15 NOTE — Telephone Encounter (Signed)
  Dr. Tresa Endo,   I visited my PCP today and got a lipid panel, my first since starting [place] three months ago. Here are the results, with what I believe to be the best saved for last:   NAME                               New                     Previous Chol                                  122                      154 HDLD                                52                        53 Trig                                    55                        78 NHDL                                70                        101 LDL                                    55                        86   Seems to be working quite well. I think I remember (perhaps erroneously) you saying that if we could get LDL under 70 it might actually reverse some plaque accumulation. Is this accurate?   Thanks, Carlynn Spry

## 2022-10-15 NOTE — Telephone Encounter (Signed)
See other telephone forwarded to TK for review

## 2022-10-18 NOTE — Telephone Encounter (Signed)
Labs look great.

## 2022-11-01 ENCOUNTER — Encounter: Payer: Self-pay | Admitting: Physician Assistant

## 2022-11-07 ENCOUNTER — Other Ambulatory Visit: Payer: 59

## 2022-11-12 ENCOUNTER — Ambulatory Visit: Payer: 59 | Admitting: Physician Assistant

## 2022-11-12 ENCOUNTER — Ambulatory Visit: Payer: 59

## 2023-07-26 ENCOUNTER — Ambulatory Visit: Payer: 59 | Attending: Cardiovascular Disease | Admitting: Cardiovascular Disease

## 2023-07-26 ENCOUNTER — Encounter: Payer: Self-pay | Admitting: Cardiovascular Disease

## 2023-07-26 VITALS — BP 110/82 | HR 52 | Ht 68.0 in | Wt 156.4 lb

## 2023-07-26 DIAGNOSIS — E7841 Elevated Lipoprotein(a): Secondary | ICD-10-CM | POA: Diagnosis not present

## 2023-07-26 DIAGNOSIS — E785 Hyperlipidemia, unspecified: Secondary | ICD-10-CM

## 2023-07-26 DIAGNOSIS — I2541 Coronary artery aneurysm: Secondary | ICD-10-CM

## 2023-07-26 DIAGNOSIS — I2510 Atherosclerotic heart disease of native coronary artery without angina pectoris: Secondary | ICD-10-CM | POA: Diagnosis not present

## 2023-07-26 DIAGNOSIS — I240 Acute coronary thrombosis not resulting in myocardial infarction: Secondary | ICD-10-CM

## 2023-07-26 MED ORDER — ROSUVASTATIN CALCIUM 40 MG PO TABS: 40.0000 mg | ORAL_TABLET | Freq: Every day | ORAL | 3 refills | Status: AC

## 2023-07-26 NOTE — Progress Notes (Unsigned)
Cardiology Office Note    Date:  07/28/2023   ID:  [name], DOB [birth year], MRN [id number]  PCP:  Dr. Tally Joe  Cardiologist:  Nicki Guadalajara, MD   12 month F/U cardiology evaluation initially referred through the courtesy of Dr. 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  History of Present Illness:  Jonathon Lopez is a 63 y.o. male who I saw for initial cardiology evaluation April 16, 2021 and last saw him on July 26, 2022.  He presents for yearly follow-up evaluation.  Jonathon Lopez denies any cardiac history.  He has remained active and runs on a daily basis, typically 3 miles at x4 to 5 miles at a time.  He ran a half marathon earlier this year.  He denies any history of chest pain.  He does note occasional heart skipping occurs mostly when he is resting.  He recently came more cognizant of his diet and excluded aspartame with improvement in his palpitations.  He runs he typically runs a 10-minute mile and he believes his heart rate may increase to around 160 bpm.  He is unaware of exertional palpitations.  He has a history of hyperlipidemia and had been on lovastatin for years, but stopped using treatment in early 2021.  He currently is not on any medications.  He now sees Dr. Susa Raring since Dr. Catha Gosselin had retired he was referred for CT cardiac scoring.  This was done on February 03, 2021 and showed a calcium score of 33.7, representing 55th percentile for age, gender and ethnicity.  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  A normal-appearing right coronary artery was not identified on this examination and cardiology consultation was recommended.  He was also noted to have a few tiny pulmonary nodules with the largest measuring 2 mm.    When I saw him for my initial evaluation, he was asymptomatic.  With his coronary calcification I recommended he initiate lipid-lowering therapy with  rosuvastatin 20 mg.  I recommended he undergo a 2D echo Doppler study and coronary CTA.  His echo Doppler study on May 06, 2021 showed hyperdynamic LV function with EF 7075% without wall motion abnormalities.  Coronary CTA demonstrated a calcium score of 19.1, which was 45th percentile for age, sex and race matched controls.  There was minimal calcification of less than 25% in the proximal and mid LAD.  He had an absent RCA.  The left circumflex vessel was superdominant and extended into the right AV groove.  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  On the CT over read, several indeterminate low-attenuation liver lesions were noted.  He subsequently was referred for and a MRI which demonstrated several benign hepatic cysts which correlate with the lesion seen on his cardiac CT.  There was no evidence for hepatic neoplasm or other significant abnormality.  I saw him on July 22, 2021.  Jonathon Lopez continued to be active.  He denied chest pain or shortness of breath.    I last saw him on remained stable.  He was still running approximately 15 to 20 miles per week and usually runs 4 to 5 days/week.  Remotely, he had experienced PACs but these stopped once he discontinued diet drinks.  He has been taking rosuvastatin 20 mg for hyperlipidemia.  Laboratory in November 2022 showed a cholesterol of 149, HDL 47,  LDL 89.  Most recent laboratory at [facility] on February 12, 2022 showed total cholesterol 154, HDL 53, LDL 86 and triglycerides 78.  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  During that evaluation, with his elevated LP(a) I suggested a target LDL less than 50 and recommended the addition of Zetia 10 mg to his current dose of rosuvastatin.  I also discussed potential PCSK9 inhibition which can reduce LP(a) by 26 to 30%.  Since I last saw him, Jonathon Lopez continues to feel well.  He continues to run at least 3 miles 3 to 5 days/week.   He is semiretired.  He continues to be on Zetia 10 mg and rosuvastatin 20 mg for hyperlipidemia.  His father died at age 60 and had been a 2 pack/day smoker.  He continues to see Dr. Susa Raring for primary care.  He presents for evaluation.    Past Medical History:  Diagnosis Date   GERD (gastroesophageal reflux disease)    Hyperlipidemia     Past Surgical History:  Procedure Laterality Date   SHOULDER SURGERY  1978   VASECTOMY  2005    Current Medications: Outpatient Medications Prior to Visit  Medication Sig Dispense Refill   ezetimibe (ZETIA) 10 MG tablet Take 1 tablet (10 mg total) by mouth daily. 90 tablet 3   rosuvastatin (CRESTOR) 20 MG tablet Take 20 mg by mouth daily.     rosuvastatin (CRESTOR) 20 MG tablet Take 1 tablet (20 mg total) by mouth daily. 90 tablet 3   No facility-administered medications prior to visit.     Allergies:   Patient has no known allergies.   Social History   Socioeconomic History   Marital status: Married    Spouse name: Not on file   Number of children: Not on file   Years of education: Not on file   Highest education level: Not on file  Occupational History   Not on file  Tobacco Use   Smoking status: Former   Smokeless tobacco: Former    Quit date: 10/25/1995  Substance and Sexual Activity   Alcohol use: No   Drug use: No   Sexual activity: Not on file  Other Topics Concern   Not on file  Social History Narrative   Not on file   Social Drivers of Health   Financial Resource Strain: Not on file  Food Insecurity: Not on file  Transportation Needs: Not on file  Physical Activity: Not on file  Stress: Not on file  Social Connections: Not on file    Social history is notable that he was born in [place], [place].  He moved and went to high school in [place].  He went to [employer] for college and did graduate work at [employer].  He is married for 24 years and has 2 children.  Currently he works in Consulting civil engineer for [employer].  There is remote tobacco use and he quit in 1995.  His has remote alcohol use with recovery for 38 years.  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  Family History: His mother died at age 6 and had cancer.  Father died at age 28 and may have had a coronary event in his 5s.  He has a half brother age 42 who is alive and well.  His sister is 63 and stable.  His children are 54 and 21 with a child at [facility].  ROS General: Negative;  No fevers, chills, or night sweats;  HEENT: Negative; No changes in vision or hearing, sinus congestion, difficulty swallowing Pulmonary: Negative; No cough, wheezing, shortness of breath, hemoptysis Cardiovascular: Negative; No chest pain, presyncope, syncope, palpitations GI: Negative; No nausea, vomiting, diarrhea, or abdominal pain GU: Negative; No dysuria, hematuria, or difficulty voiding Musculoskeletal: Negative; no myalgias, joint pain, or weakness Hematologic/Oncology: Negative; no easy bruising, bleeding Endocrine: Negative; no heat/cold intolerance; no diabetes Neuro: Negative; no changes in balance, headaches Skin: Negative; No rashes or skin lesions Psychiatric: Negative; No behavioral problems, depression Sleep: Negative; No snoring, daytime sleepiness, hypersomnolence, bruxism, restless legs, hypnogognic hallucinations, no cataplexy Other comprehensive 14 point system review is negative.   PHYSICAL EXAM:   VS:  BP 110/82 (BP Location: Left Arm, Patient Position: Sitting, Cuff Size: Normal)   Pulse (!) 52   Ht 5\' 8"  (1.727 m)   Wt 156 lb 6.4 oz (70.9 kg)   SpO2 98%   BMI 23.78 kg/m     Repeat blood pressure by me was 126/78  Wt Readings from Last 3 Encounters:  07/26/23 156 lb 6.4 oz (70.9 kg)  07/26/22 166 lb 3.2 oz (75.4 kg)  07/22/21 167 lb 6.4 oz (75.9 kg)    General: Alert, oriented, no distress.  Skin: normal turgor, no rashes, warm and dry HEENT: Normocephalic, atraumatic. Pupils equal  round and reactive to light; sclera anicteric; extraocular muscles intact;  Nose without nasal septal hypertrophy Mouth/Parynx benign; Mallinpatti scale 3 Neck: No JVD, no carotid bruits; normal carotid upstroke Lungs: clear to ausculatation and percussion; no wheezing or rales Chest wall: without tenderness to palpitation Heart: PMI not displaced, RRR, s1 s2 normal, 1/6 systolic murmur, no diastolic murmur, no rubs, gallops, thrills, or heaves Abdomen: soft, nontender; no hepatosplenomehaly, BS+; abdominal aorta nontender and not dilated by palpation. Back: no CVA tenderness Pulses 2+ Musculoskeletal: full range of motion, normal strength, no joint deformities Extremities: no clubbing cyanosis or edema, Homan's sign negative  Neurologic: grossly nonfocal; Cranial nerves grossly wnl Psychologic: Normal mood and affect    Studies/Labs Reviewed:   EKG Interpretation Date/Time:  Tuesday July 26 2023 11:13:55 EST Ventricular Rate:  52 PR Interval:  146 QRS Duration:  90 QT Interval:  426 QTC Calculation: 396 R Axis:   50  Text Interpretation: Sinus bradycardia No previous ECGs available Confirmed by Nicki Guadalajara ([id number]) on 07/26/2023 12:04:44 PM    July 26, 2022 ECG (independently read by me): NSR at 63, no ectopy  April 16, 2021 ECG (independently read by me):  Sinus rhythm at 65 with PAC  Recent Labs:    Latest Ref Rng & Units 04/27/2021    2:28 PM  BMP  Glucose 70 - 99 mg/dL 87   BUN 8 - 27 mg/dL 11   Creatinine 6.04 - 1.27 mg/dL 5.40   BUN/Creat Ratio 10 - 24 10   Sodium 134 - 144 mmol/L 140   Potassium 3.5 - 5.2 mmol/L 4.4   Chloride 96 - 106 mmol/L 100   CO2 20 - 29 mmol/L 26   Calcium 8.6 - 10.2 mg/dL 9.2         Latest Ref Rng & Units 04/27/2021    2:28 PM  Hepatic Function  Total Protein 6.0 - 8.5 g/dL 6.6   Albumin 3.8 - 4.9 g/dL 4.4   AST 0 - 40 IU/L 25   ALT 0 - 44 IU/L 18   Alk Phosphatase 44 - 121 IU/L 77   Total Bilirubin 0.0 - 1.2  mg/dL  0.7         No data to display         No results found for: "MCV" No results found for: "TSH" No results found for: "HGBA1C"   BNP No results found for: "BNP"  ProBNP No results found for: "PROBNP"   Lipid Panel     Component Value Date/Time   CHOL 149 04/27/2021 1428   TRIG 62 04/27/2021 1428   HDL 47 04/27/2021 1428   CHOLHDL 3.2 04/27/2021 1428   LDLCALC 89 04/27/2021 1428   LABVLDL 13 04/27/2021 1428     RADIOLOGY: No results found.   Additional studies/ records that were reviewed today include:  I reviewed the records from [place] at [facility].  CT cardiac scoring was reviewed.  Laboratory from January 06, 2021 was reviewed which showed a total cholesterol 233, LDL 145, HDL 51, triglycerides 204.  ASSESSMENT:    1. Coronary artery calcification   2. 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 Elevated Lp(a)   5. Hyperlipidemia with target LDL less than 50     PLAN:  Jonathon Lopez is a very pleasant 13 -year-old gentleman who has been active for most of his life.  Typically runs often 3 miles 3-5 times per week.  In 2022  he rana half marathon without difficulty and was doing CrossFit training without chest discomfort.  He has noticed a rare palpitation.  He has a history of hyperlipidemia and had been on lipid-lowering therapy for over 20 years but stopped treatment in early 2021.  When I initially saw him, I reviewed his most recent lipid panel which was elevated and suggests a mixed hyperlipidemic picture.  LDL cholesterol was 145 and triglycerides 204 with total cholesterol 233 and HDL 51.  I reviewed his CT cardiac scoring evaluation which showed calcium score of 33.7 and raised concern for a possible hypoplastic RCA.  He subsequently underwent a 2D echo Doppler study which showed hyperdynamic LV function without wall motion abnormality.  I recommended he undergo coronary CTA which revealed calcium score of 19.1 representing 45 percentile  for age, sex and race matched control.  There was minimal LAD plaque less than 25%.  His RCA was absent and the left circumflex vessel was superdominant extending into the right AV groove.  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  On the CT over read there was several low-attenuation lesions in the right hepatic lobe of the liver.  On subsequent MRI these were consistent with several benign hepatic cysts.  There is no evidence for hepatic neoplasm or other abnormality.  Jonathon Lopez had remained asymptomatic.  With his coronary calcification I initiated therapy with rosuvastatin 20 mg and following his LP(a) assessment Zetia 10 mg was subsequently added.  I reviewed his most recent lipid panel from September 2024 which showed improvement with total cholesterol 129, HDL 53 LDL 63 and triglycerides 66.  With his LP(a) at 209.7, I have recommended target LDL less than 50.  After discussion, he agrees to further titrate rosuvastatin 40 mg and continue with Zetia 10 mg.  Also have recommended initiation of a baby aspirin 81 mg with his LP(a) elevation.  In approximately 3 to 4 months, we will recheck a comprehensive metabolic panel and lipid panel on current therapy.  I discussed with him my plans for retirement at the end of June.  I have  recommended that he see Dr. Epifanio Lesches for transition of care following my retirement and have recommended a 1 year follow-up evaluation.  Medication Adjustments/Labs and Tests Ordered: Current medicines are reviewed at length with the patient today.  Concerns regarding medicines are outlined above.  Medication changes, Labs and Tests ordered today are listed in the Patient Instructions below. Patient Instructions  Medication Instructions:  INCREASE YOUR ROSUVASTATIN FROM 20MG  TO 40MG  DAILY.  *If you need a refill on your cardiac medications before your next appointment, please call your  pharmacy*   Lab Work: IN [place], [place] WILL RETURN FOR FASTING LABS If you have labs (blood work) drawn today and your tests are completely normal, you will receive your results only by: MyChart Message (if you have MyChart) OR A paper copy in the mail If you have any lab test that is abnormal or we need to change your treatment, we will call you to review the results.   Testing/Procedures: No procedures ordered today.    Follow-Up: At [facility], you and your health needs are our priority.  As part of our continuing mission to provide you with exceptional heart care, we have created designated Provider Care Teams.  These Care Teams include your primary Cardiologist (physician) and Advanced Practice Providers (APPs -  Physician Assistants and Nurse Practitioners) who all work together to provide you with the care you need, when you need it.  We recommend signing up for the patient portal called "MyChart".  Sign up information is provided on this After Visit Summary.  MyChart is used to connect with patients for Virtual Visits (Telemedicine).  Patients are able to view lab/test results, encounter notes, upcoming appointments, etc.  Non-urgent messages can be sent to your provider as well.   To learn more about what you can do with MyChart, go to [email or web address].    Your next appointment:   1 year(s)  Provider:   DR. Epifanio Lesches     A letter will be mailed to you as a reminder to call the office for your follow up appointment.  Thank you for choosing [facility]!      Signed, Nicki Guadalajara, MD  07/28/2023 4:37 PM    [facility] [address], [place], [place]  [address] Phone: [phone]

## 2023-07-26 NOTE — Patient Instructions (Addendum)
Medication Instructions:  INCREASE YOUR ROSUVASTATIN FROM 20MG  TO 40MG  DAILY.  *If you need a refill on your cardiac medications before your next appointment, please call your pharmacy*   Lab Work: IN [place], [place] WILL RETURN FOR FASTING LABS If you have labs (blood work) drawn today and your tests are completely normal, you will receive your results only by: MyChart Message (if you have MyChart) OR A paper copy in the mail If you have any lab test that is abnormal or we need to change your treatment, we will call you to review the results.   Testing/Procedures: No procedures ordered today.    Follow-Up: At [facility], you and your health needs are our priority.  As part of our continuing mission to provide you with exceptional heart care, we have created designated Provider Care Teams.  These Care Teams include your primary Cardiologist (physician) and Advanced Practice Providers (APPs -  Physician Assistants and Nurse Practitioners) who all work together to provide you with the care you need, when you need it.  We recommend signing up for the patient portal called "MyChart".  Sign up information is provided on this After Visit Summary.  MyChart is used to connect with patients for Virtual Visits (Telemedicine).  Patients are able to view lab/test results, encounter notes, upcoming appointments, etc.  Non-urgent messages can be sent to your provider as well.   To learn more about what you can do with MyChart, go to [email or web address].    Your next appointment:   1 year(s)  Provider:   DR. Epifanio Lesches     A letter will be mailed to you as a reminder to call the office for your follow up appointment.  Thank you for choosing [facility]!

## 2023-07-28 ENCOUNTER — Encounter: Payer: Self-pay | Admitting: Cardiovascular Disease

## 2023-08-10 ENCOUNTER — Other Ambulatory Visit: Payer: Self-pay | Admitting: Cardiovascular Disease

## 2023-12-24 ENCOUNTER — Other Ambulatory Visit: Payer: Self-pay

## 2023-12-24 ENCOUNTER — Emergency Department (HOSPITAL_BASED_OUTPATIENT_CLINIC_OR_DEPARTMENT_OTHER)
Admission: EM | Admit: 2023-12-24 | Discharge: 2023-12-24 | Disposition: A | Discharge status: Home / Self Care | Source: Ambulatory Visit

## 2023-12-24 ENCOUNTER — Emergency Department (HOSPITAL_BASED_OUTPATIENT_CLINIC_OR_DEPARTMENT_OTHER)

## 2023-12-24 ENCOUNTER — Encounter (HOSPITAL_BASED_OUTPATIENT_CLINIC_OR_DEPARTMENT_OTHER): Payer: Self-pay | Admitting: Emergency Medicine

## 2023-12-24 DIAGNOSIS — R42 Dizziness and giddiness: Secondary | ICD-10-CM | POA: Insufficient documentation

## 2023-12-24 LAB — URINALYSIS, ROUTINE W REFLEX MICROSCOPIC
Bacteria, UA: NONE SEEN
Bilirubin Urine: NEGATIVE
Glucose, UA: NEGATIVE mg/dL
Ketones, ur: NEGATIVE mg/dL
Leukocytes,Ua: NEGATIVE
Nitrite: NEGATIVE
Protein, ur: NEGATIVE mg/dL
Specific Gravity, Urine: <1.005 — ABNORMAL LOW (ref 1.005–1.030)
pH: 7 (ref 5.0–8.0)

## 2023-12-24 LAB — COMPREHENSIVE METABOLIC PANEL WITH GFR
ALT: 20 U/L (ref 0–44)
AST: 24 U/L (ref 15–41)
Albumin: 4.4 g/dL (ref 3.5–5.0)
Alkaline Phosphatase: 67 U/L (ref 38–126)
Anion gap: 11 (ref 5–15)
BUN: 14 mg/dL (ref 8–23)
CO2: 28 mmol/L (ref 22–32)
Calcium: 9.6 mg/dL (ref 8.9–10.3)
Chloride: 104 mmol/L (ref 98–111)
Creatinine, Ser: 1.13 mg/dL (ref 0.61–1.24)
GFR, Estimated: >60 mL/min (ref >60)
Glucose, Bld: 110 mg/dL — ABNORMAL HIGH (ref 70–99)
Potassium: 4 mmol/L (ref 3.5–5.1)
Sodium: 142 mmol/L (ref 135–145)
Total Bilirubin: 0.5 mg/dL (ref 0.0–1.2)
Total Protein: 6.6 g/dL (ref 6.5–8.1)

## 2023-12-24 LAB — CBC
HCT: 42.4 % (ref 39.0–52.0)
Hemoglobin: 14.5 g/dL (ref 13.0–17.0)
MCH: 31.7 pg (ref 26.0–34.0)
MCHC: 34.2 g/dL (ref 30.0–36.0)
MCV: 92.6 fL (ref 80.0–100.0)
Platelets: 249 K/uL (ref 150–400)
RBC: 4.58 MIL/uL (ref 4.22–5.81)
RDW: 12.9 % (ref 11.5–15.5)
WBC: 9.7 K/uL (ref 4.0–10.5)
nRBC: 0 % (ref 0.0–0.2)

## 2023-12-24 LAB — TROPONIN T, HIGH SENSITIVITY: Troponin T High Sensitivity: <15 ng/L (ref <19)

## 2023-12-24 MED ORDER — SODIUM CHLORIDE 0.9 % IV BOLUS
500.0000 mL | Freq: Once | INTRAVENOUS | Status: AC
  Administered 2023-12-24: 500 mL via INTRAVENOUS

## 2023-12-24 NOTE — Discharge Instructions (Addendum)
 Please follow-up with cardiologist especially the symptoms continue.  I think if your symptoms continue you may need a Holter monitor.  This is where we monitor your heart rate for a long period of time.  Your workup today was unremarkable.  CT head is benign.  Your laboratory workup was unremarkable.  EKG showed no changes compared to prior.  Once again, I do think follow-up with your cardiologist is recommended this point time for further testing.

## 2023-12-24 NOTE — ED Notes (Signed)
 Patient transported to CT

## 2023-12-24 NOTE — ED Triage Notes (Signed)
 Pt via pov from PCP with dizziness and nausea x 2 days (4-5 episodes), intermittently. Pt was sent for evaluation and ct scan.

## 2023-12-24 NOTE — ED Provider Notes (Signed)
  EMERGENCY DEPARTMENT AT [facility] Provider Note   CSN: [id number] Arrival date & time: 12/24/23  1527     Patient presents with: Dizziness   Jonathon Lopez is a 63 y.o. male.    Dizziness      Patient presents because episodes of feeling lightheaded.  Patient states this happened a couple times every day for the past 2 to 3 days.  Patient states it is more random in nature.  He will be doing something subsequently feel lightheaded.  He will then feel slightly nauseous.  He will sit down or lie down and subsequently feel much better.  Only last for 1 to 2 minutes.  No vision changes.  No numbness or ting anywhere.  Does not necessarily feel like the room is spinning but just feels lightheaded.  Denies any palpitations before or after.  No chest pain before or after.  No shortness of breath.  Otherwise states has been feeling his normal state of health.  No vomiting.  No abdominal pain.  No fever no chills.  No pleuritic chest pain or hemoptysis.  No exertional chest pain.  Continues to run 2 to 3 miles without a count of chest pain or increasing shortness of breath.  Denies any history of arrhythmia.  Previous medical history reviewed : Patient last followed up with cardiology in February 2025.  History of 2D echo Doppler study which showed hyperdynamic LV function without wall motion abnormality.  Coronary CTA.  Calcium  score 90.1.  Minimal LAD plaque.  RCA was absent and the left circumflex vessel was superdominant extending into the right AV groove.  Patient is on a statin.  Zetia .   Prior to Admission medications   Medication Sig Start Date End Date Taking? Authorizing Provider  ezetimibe  (ZETIA ) 10 MG tablet TAKE 1 TABLET BY MOUTH EVERY DAY 08/10/23   Burnard Debby LABOR, MD  rosuvastatin  (CRESTOR ) 40 MG tablet Take 1 tablet (40 mg total) by mouth daily. 07/26/23 10/24/23  Burnard Debby LABOR, MD    Allergies: Patient has no known allergies.    Review of Systems   Neurological:  Positive for dizziness.    Updated Vital Signs BP 120/84   Pulse (!) 59   Temp 97.8 F (36.6 C) (Oral)   Resp 18   Ht 5' 8 (1.727 m)   Wt 70.3 kg   SpO2 99%   BMI 23.57 kg/m   Physical Exam Vitals and nursing note reviewed.  Constitutional:      General: He is not in acute distress.    Appearance: He is well-developed.  HENT:     Head: Normocephalic and atraumatic.  Eyes:     Conjunctiva/sclera: Conjunctivae normal.  Cardiovascular:     Rate and Rhythm: Normal rate and regular rhythm.     Heart sounds: No murmur heard. Pulmonary:     Effort: Pulmonary effort is normal. No respiratory distress.     Breath sounds: Normal breath sounds.  Abdominal:     Palpations: Abdomen is soft.     Tenderness: There is no abdominal tenderness.  Musculoskeletal:        General: No swelling.     Cervical back: Neck supple.  Skin:    General: Skin is warm and dry.     Capillary Refill: Capillary refill takes less than 2 seconds.  Neurological:     Mental Status: He is alert and oriented to person, place, and time.     GCS: GCS eye subscore is 4. GCS  verbal subscore is 5. GCS motor subscore is 6.     Cranial Nerves: Cranial nerves 2-12 are intact. No cranial nerve deficit, dysarthria or facial asymmetry.     Sensory: Sensation is intact. No sensory deficit.     Motor: Motor function is intact. No weakness or pronator drift.     Coordination: Coordination is intact. Romberg sign negative. Finger-Nose-Finger Test normal.     Gait: Gait is intact. Gait normal.  Psychiatric:        Mood and Affect: Mood normal.     (all labs ordered are listed, but only abnormal results are displayed) Labs Reviewed  COMPREHENSIVE METABOLIC PANEL WITH GFR - Abnormal; Notable for the following components:      Result Value   Glucose, Bld 110 (*)    All other components within normal limits  URINALYSIS, ROUTINE W REFLEX MICROSCOPIC - Abnormal; Notable for the following components:    Color, Urine COLORLESS (*)    Specific Gravity, Urine <1.005 (*)    Hgb urine dipstick TRACE (*)    All other components within normal limits  CBC  TROPONIN T, HIGH SENSITIVITY    EKG: EKG Interpretation Date/Time:  Saturday December 24 2023 15:52:34 EDT Ventricular Rate:  60 PR Interval:  137 QRS Duration:  94 QT Interval:  396 QTC Calculation: 396 R Axis:   49  Text Interpretation: Sinus rhythm no changes when compared to prior Confirmed by Simon Rea [phone]) on 12/24/2023 4:01:08 PM  Radiology: CT Head Wo Contrast Result Date: 12/24/2023 CLINICAL DATA:  vertigo. eval for subacute cva posterior path EXAM: CT HEAD WITHOUT CONTRAST TECHNIQUE: Contiguous axial images were obtained from the base of the skull through the vertex without intravenous contrast. RADIATION DOSE REDUCTION: This exam was performed according to the departmental dose-optimization program which includes automated exposure control, adjustment of the mA and/or kV according to patient size and/or use of iterative reconstruction technique. COMPARISON:  MRI of the head dated the September 12, 2004. FINDINGS: Brain: Normal brain. No evidence of hemorrhage, mass, cortical infarct or hydrocephalus. Vascular: Negative. Skull: Negative. Sinuses/Orbits: Clear paranasal sinuses.  Normal orbits. Other: Negative. IMPRESSION: Normal. Electronically Signed   By: Evalene Coho M.D.   On: 12/24/2023 16:45     Procedures   Medications Ordered in the ED  sodium chloride  0.9 % bolus 500 mL (0 mLs Intravenous Stopped 12/24/23 1752)                                    Medical Decision Making Amount and/or Complexity of Data Reviewed Labs: ordered. Radiology: ordered.     Patient presents because episodes of feeling lightheaded.  Patient states this happened a couple times every day for the past 2 to 3 days.  Patient states it is more random in nature.  He will be doing something subsequently feel lightheaded.  He will then feel  slightly nauseous.  He will sit down or lie down and subsequently feel much better.  Only last for 1 to 2 minutes.  No vision changes.  No numbness or ting anywhere.  Does not necessarily feel like the room is spinning but just feels lightheaded.  Denies any palpitations before or after.  No chest pain before or after.  No shortness of breath.  Otherwise states has been feeling his normal state of health.  No vomiting.  No abdominal pain.  No fever no chills.  No pleuritic  chest pain or hemoptysis.  No exertional chest pain.  Continues to run 2 to 3 miles without a count of chest pain or increasing shortness of breath.  Denies any history of arrhythmia.  Previous medical history reviewed : Patient last followed up with cardiology in February 2025.  History of 2D echo Doppler study which showed hyperdynamic LV function without wall motion abnormality.  Coronary CTA.  Calcium  score 90.1.  Minimal LAD plaque.  RCA was absent and the left circumflex vessel was superdominant extending into the right AV groove.  Patient is on a statin.  Zetia .   Upon exam, patient hemoglobin stable.  ANO x 3 GCS 15.  NIH is 0.  Cranial nerves II through XII intact.  Normal finger-nose.  Romberg negative.  Maybe some slight corrective saccade horizontal but no vertical or rotary.  Able to walk in straight line without difficulty.  Normal heel-to-shin.  My exam is not concerning for any kind of posterior CVA at this point time.   EKG reviewed.  Normal sinus rhythm.   Cardiac telemetry reviewed: Normal sinus rhythm.  No arrhythmia.  Did obtain CT head.  No concerns for any kind of posterior CVA is very low given patient's lack of health risk factors as well as the patient's normal neurologic exam.  I did a eval for subacute CVA with CT head.  Unremarkable.  Do not think he needs to be admitted for MRI brain at this point in time given low risk profile.  Electrolytes normal.  Troponin undetectable.  Symptoms been present for  greater than 2 to 3 days.  No repeat troponin needed given atypical symptoms.  No chest pain.  Did obtain a troponin to rule out, atypical ACS pathology.  My concerns for this is very low given patient's previous cardiac workup.   I do think patient should follow-up with cardiology for Holter monitoring if symptoms continue.   Otherwise, patient cleared for discharge home.       Final diagnoses:  Lightheadedness    ED Discharge Orders     None          Simon Lavonia SAILOR, MD 12/24/23 [phone]
